# Patient Record
Sex: Male | Born: 1985 | Race: White | Hispanic: No | Marital: Single | Smoking: Current every day smoker
Health system: Southern US, Community
[De-identification: ages and names within clinical notes are randomized; demographics above are authoritative.]

## PROBLEM LIST (undated history)

## (undated) DIAGNOSIS — N200 Calculus of kidney: Secondary | ICD-10-CM

## (undated) DIAGNOSIS — J40 Bronchitis, not specified as acute or chronic: Secondary | ICD-10-CM

## (undated) DIAGNOSIS — F319 Bipolar disorder, unspecified: Secondary | ICD-10-CM

## (undated) DIAGNOSIS — K219 Gastro-esophageal reflux disease without esophagitis: Secondary | ICD-10-CM

---
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
## 2008-12-06 ENCOUNTER — Ambulatory Visit: Payer: Self-pay | Admitting: Family Medicine

## 2009-06-16 ENCOUNTER — Ambulatory Visit: Payer: Self-pay | Admitting: Family Medicine

## 2010-06-23 ENCOUNTER — Ambulatory Visit: Payer: Self-pay | Admitting: Family Medicine

## 2013-03-20 ENCOUNTER — Encounter (HOSPITAL_COMMUNITY): Payer: Self-pay | Admitting: Emergency Medicine

## 2013-03-20 ENCOUNTER — Emergency Department (HOSPITAL_COMMUNITY)
Admission: EM | Admit: 2013-03-20 | Discharge: 2013-03-20 | Disposition: A | Discharge status: Home / Self Care | Payer: Medicaid Other | Attending: Emergency Medicine | Admitting: Emergency Medicine

## 2013-03-20 DIAGNOSIS — K089 Disorder of teeth and supporting structures, unspecified: Secondary | ICD-10-CM | POA: Insufficient documentation

## 2013-03-20 DIAGNOSIS — Z8659 Personal history of other mental and behavioral disorders: Secondary | ICD-10-CM | POA: Insufficient documentation

## 2013-03-20 DIAGNOSIS — K0889 Other specified disorders of teeth and supporting structures: Secondary | ICD-10-CM

## 2013-03-20 DIAGNOSIS — Y939 Activity, unspecified: Secondary | ICD-10-CM | POA: Insufficient documentation

## 2013-03-20 DIAGNOSIS — F17200 Nicotine dependence, unspecified, uncomplicated: Secondary | ICD-10-CM | POA: Insufficient documentation

## 2013-03-20 DIAGNOSIS — R296 Repeated falls: Secondary | ICD-10-CM | POA: Insufficient documentation

## 2013-03-20 DIAGNOSIS — K029 Dental caries, unspecified: Secondary | ICD-10-CM | POA: Insufficient documentation

## 2013-03-20 DIAGNOSIS — Y929 Unspecified place or not applicable: Secondary | ICD-10-CM | POA: Insufficient documentation

## 2013-03-20 HISTORY — DX: Bipolar disorder, unspecified: F31.9

## 2013-03-20 MED ORDER — AMOXICILLIN 250 MG PO CAPS
500.0000 mg | ORAL_CAPSULE | Freq: Once | ORAL | Status: AC
  Administered 2013-03-20: 500 mg via ORAL
  Filled 2013-03-20: qty 2

## 2013-03-20 MED ORDER — IBUPROFEN 800 MG PO TABS: 800.0000 mg | ORAL_TABLET | Freq: Three times a day (TID) | ORAL | Status: DC

## 2013-03-20 MED ORDER — IBUPROFEN 800 MG PO TABS
800.0000 mg | ORAL_TABLET | Freq: Once | ORAL | Status: AC
  Administered 2013-03-20: 800 mg via ORAL
  Filled 2013-03-20: qty 1

## 2013-03-20 MED ORDER — AMOXICILLIN 500 MG PO CAPS: 500.0000 mg | ORAL_CAPSULE | Freq: Three times a day (TID) | ORAL | Status: DC

## 2013-03-20 NOTE — ED Notes (Signed)
Pt reports drinking heavily on Saturday, fell from knelled position with LOC. Reports feeling nauseous x 2days afterwards. C/o dental pain x 3 years. Scheduled for dentist appointment later this week.

## 2013-03-20 NOTE — ED Provider Notes (Signed)
CSN: [id number]     Arrival date & time 03/20/13  2212 History     First MD Initiated Contact with Patient 03/20/13 2246     Chief Complaint  Patient presents with  . Fall  . Dental Pain   (Consider location/radiation/quality/duration/timing/severity/associated sxs/prior Treatment) HPI Comments: Pt states that early morning of Aug 10 he was drinking heavily, fell from a kneeling position and hit his head. He states he later passed out, but thought it was from the alcohol. No problem walking or talking the next day. No n/v after the alcohol wore off.  No problems with performing ADL. No syncopal or near syncopal episodes since that time.  Patient is a 27 y.o. male presenting with fall and tooth pain. The history is provided by the patient.  Fall This is a chronic problem. The current episode started more than 1 year ago. The problem occurs intermittently. The problem has been gradually worsening. Pertinent negatives include no abdominal pain, arthralgias, chest pain, coughing, fever, nausea, neck pain or vomiting. Nothing aggravates the symptoms. He has tried NSAIDs for the symptoms.  Dental Pain Associated symptoms: no fever and no neck pain     Past Medical History  Diagnosis Date  . Bipolar 1 disorder    History reviewed. No pertinent past surgical history. History reviewed. No pertinent family history. History  Substance Use Topics  . Smoking status: Current Every Day Smoker  . Smokeless tobacco: Not on file  . Alcohol Use: Yes     Comment: occasional    Review of Systems  Constitutional: Negative for fever and activity change.       All ROS Neg except as noted in HPI  HENT: Positive for dental problem. Negative for nosebleeds and neck pain.   Eyes: Negative for photophobia and discharge.  Respiratory: Negative for cough, shortness of breath and wheezing.   Cardiovascular: Negative for chest pain and palpitations.  Gastrointestinal: Negative for nausea, vomiting,  abdominal pain and blood in stool.  Genitourinary: Negative for dysuria, frequency and hematuria.  Musculoskeletal: Negative for back pain and arthralgias.  Skin: Negative.   Neurological: Negative for dizziness, seizures and speech difficulty.  Psychiatric/Behavioral: Negative for hallucinations and confusion.    Allergies  Review of patient's allergies indicates no known allergies.  Home Medications   Current Outpatient Rx  Name  Route  Sig  Dispense  Refill  . ibuprofen (ADVIL,MOTRIN) 200 MG tablet   Oral   Take 200-800 mg by mouth every 6 (six) hours as needed for pain.          Marland Kitchen amoxicillin (AMOXIL) 500 MG capsule   Oral   Take 1 capsule (500 mg total) by mouth 3 (three) times daily.   21 capsule   0   . ibuprofen (ADVIL,MOTRIN) 800 MG tablet   Oral   Take 1 tablet (800 mg total) by mouth 3 (three) times daily.   21 tablet   0    BP 131/79  Pulse 88  Temp(Src) 98.2 F (36.8 C) (Oral)  Resp 18  SpO2 99% Physical Exam  Nursing note and vitals reviewed. Constitutional: He is oriented to person, place, and time. He appears well-developed and well-nourished.  Non-toxic appearance.  HENT:  Head: Normocephalic and atraumatic.  Right Ear: Tympanic membrane and external ear normal.  Left Ear: Tympanic membrane and external ear normal.  Multiple dental caries of the upper and lower gums. No visible abscess. No swelling under the tongue. Airway patent.  Eyes: EOM and lids  are normal. Pupils are equal, round, and reactive to light.  Neck: Normal range of motion. Neck supple. Carotid bruit is not present.  Cardiovascular: Normal rate, regular rhythm, normal heart sounds, intact distal pulses and normal pulses.   Pulmonary/Chest: Breath sounds normal. No respiratory distress.  Abdominal: Soft. Bowel sounds are normal. There is no tenderness. There is no guarding.  Musculoskeletal: Normal range of motion.  Lymphadenopathy:       Head (right side): No submandibular  adenopathy present.       Head (left side): No submandibular adenopathy present.    He has no cervical adenopathy.  Neurological: He is alert and oriented to person, place, and time. He has normal strength. No cranial nerve deficit or sensory deficit. He exhibits normal muscle tone. Coordination normal.  Gait steady. Speech clear and understandable.  Skin: Skin is warm and dry.  Psychiatric: He has a normal mood and affect. His speech is normal.    ED Course   Procedures (including critical care time)  Labs Reviewed - No data to display No results found. 1. Toothache     MDM  *I have reviewed nursing notes, vital signs, and all appropriate lab and imaging results for this patient.** Pt given dental resources guide. Rx for amoxil and ibuprofen 800mg  given. Pt has no gross neuro deficit at this time. No changes in his ADL since 8/10. No n/v. Family states he is eating and drinking as usual, and He denies any apparent changes or problem.  Feel that is is safe for patient to be discharged. Pt advised to return if he noted any changes or problem.  Kathie Dike, PA-C 03/20/13 2353

## 2013-03-21 NOTE — ED Provider Notes (Signed)
Medical screening examination/treatment/procedure(s) were performed by non-physician practitioner and as supervising physician I was immediately available for consultation/collaboration.  Warwick Nick, MD 03/21/13 0411 

## 2013-09-04 ENCOUNTER — Encounter (HOSPITAL_COMMUNITY): Payer: Self-pay | Admitting: Pharmacy Technician

## 2013-09-11 NOTE — Pre-Procedure Instructions (Signed)
Kurt Gillespie  09/11/2013   Your procedure is scheduled on:  Monday February 9 th at 1000 AM  Report to [facility] Short Stay Main Entrance "A" at 0800 AM.  Call this number if you have problems the morning of surgery: [phone]   Remember:   Do not eat food or drink liquids after midnight.   Take these medicines the morning of surgery with A SIP OF WATER: None   Do not wear jewelry.  Do not wear lotions,or powders.. You may wear deodorant.             Men may shave face and neck.  Do not bring valuables to the hospital.  [facility] is not responsible for any belongings or valuables.               Contacts, dentures or bridgework may not be worn into surgery.  Leave suitcase in the car. After surgery it may be brought to your room.  For patients admitted to the hospital, discharge time is determined by your treatment team.               Patients discharged the day of surgery will not be allowed to drive home.    Special Instructions:[place] - Preparing for Surgery  Before surgery, you can play an important role.  Because skin is not sterile, your skin needs to be as free of germs as possible.  You can reduce the number of germs on you skin by washing with CHG (chlorahexidine gluconate) soap before surgery.  CHG is an antiseptic cleaner which kills germs and bonds with the skin to continue killing germs even after washing.  Please DO NOT use if you have an allergy to CHG or antibacterial soaps.  If your skin becomes reddened/irritated stop using the CHG and inform your nurse when you arrive at Short Stay.  Do not shave (including legs and underarms) for at least 48 hours prior to the first CHG shower.  You may shave your face.  Please follow these instructions carefully:   1.  Shower with CHG Soap the night before surgery and the morning of Surgery.  2.  If you choose to wash your hair, wash your hair first as usual with your normal shampoo.  3.  After you shampoo, rinse your  hair and body thoroughly to remove the  Shampoo.  4.  Use CHG as you would any other liquid soap.  You can apply chg directly  to the skin and wash gently with scrungie or a clean washcloth.  5.  Apply the CHG Soap to your body ONLY FROM THE NECK DOWN.  Do not use on open wounds or open sores.  Avoid contact with your eyes, ears, mouth and genitals (private parts).  Wash genitals (private parts) with your normal soap.  6.  Wash thoroughly, paying special attention to the area where your surgery  will be performed.  7.  Thoroughly rinse your body with warm water from the neck down.  8.  DO NOT shower/wash with your normal soap after using and rinsing off the CHG Soap.  9.  Pat yourself dry with a clean towel.            10.  Wear clean pajamas.            11.  Place clean sheets on your bed the night of your first shower and do not  sleep with pets.  Day of Surgery  Do not apply any  lotions/deoderants the morning of surgery.  Please wear clean clothes to the hospital/surgery center.       Please read over the following fact sheets that you were given: Surgical Site Infection Prevention

## 2013-09-12 ENCOUNTER — Encounter (HOSPITAL_COMMUNITY): Payer: Self-pay

## 2013-09-12 ENCOUNTER — Encounter (HOSPITAL_COMMUNITY)
Admission: RE | Admit: 2013-09-12 | Discharge: 2013-09-12 | Disposition: A | Discharge status: Home / Self Care | Payer: Medicaid Other | Source: Ambulatory Visit | Attending: Oral Surgery | Admitting: Oral Surgery

## 2013-09-12 DIAGNOSIS — Z01812 Encounter for preprocedural laboratory examination: Secondary | ICD-10-CM | POA: Insufficient documentation

## 2013-09-12 HISTORY — DX: Bronchitis, not specified as acute or chronic: J40

## 2013-09-12 HISTORY — DX: Gastro-esophageal reflux disease without esophagitis: K21.9

## 2013-09-12 LAB — CBC
HEMATOCRIT: 40.9 % (ref 39.0–52.0)
HEMOGLOBIN: 14.3 g/dL (ref 13.0–17.0)
MCH: 29.5 pg (ref 26.0–34.0)
MCHC: 35 g/dL (ref 30.0–36.0)
MCV: 84.3 fL (ref 78.0–100.0)
Platelets: 240 10*3/uL (ref 150–400)
RBC: 4.85 MIL/uL (ref 4.22–5.81)
RDW: 13.1 % (ref 11.5–15.5)
WBC: 9.9 10*3/uL (ref 4.0–10.5)

## 2013-09-12 NOTE — H&P (Signed)
HISTORY AND PHYSICAL  Kurt LeydenKenneth Gillespie is a 28 y.o. male patient with CC: Dental pain  No diagnosis found.  Past Medical History  Diagnosis Date  . Bipolar 1 disorder   . Bronchitis     as a child  . GERD (gastroesophageal reflux disease)     No current facility-administered medications for this encounter.   Current Outpatient Prescriptions  Medication Sig Dispense Refill  . ibuprofen (ADVIL,MOTRIN) 200 MG tablet Take 200-800 mg by mouth every 6 (six) hours as needed for pain.        No Known Allergies Active Problems:   * No active hospital problems. *  Vitals: There were no vitals taken for this visit. Lab results: Results for orders placed during the hospital encounter of 09/12/13 (from the past 24 hour(s))  CBC     Status: None   Collection Time    09/12/13  9:25 AM      Result Value Range   WBC 9.9  4.0 - 10.5 K/uL   RBC 4.85  4.22 - 5.81 MIL/uL   Hemoglobin 14.3  13.0 - 17.0 g/dL   HCT 16.140.9  09.639.0 - 04.552.0 %   MCV 84.3  78.0 - 100.0 fL   MCH 29.5  26.0 - 34.0 pg   MCHC 35.0  30.0 - 36.0 g/dL   RDW 40.913.1  81.111.5 - 91.415.5 %   Platelets 240  150 - 400 K/uL   Radiology Results: No results found. General appearance: alert, cooperative, appears stated age and no distress Head: Normocephalic, without obvious abnormality, atraumatic Eyes: negative Nose: Nares normal. Septum midline. Mucosa normal. No drainage or sinus tenderness. Throat: multiple dental caries teeth #s 2-15, 17, 18, 20-32, generalized chronic periodontal disease, impacted tooth #32 Neck: no adenopathy, supple, symmetrical, trachea midline and thyroid not enlarged, symmetric, no tenderness/mass/nodules Resp: clear to auscultation bilaterally Cardio: regular rate and rhythm, S1, S2 normal, no murmur, click, rub or gallop  Assessment: Non-restorable teeth #'s 2, 3, 4, 5, 6, 7, 8, 9, 10, 11, 12, 13, 14, 15, 17, 18, 20, 21, 22, 23, 24, 25, 26, 27, 28, 29, 30, 31, impacted tooth #32.  Plan: Full mouth extractions,  alveoloplasty. General anesthesia. Day surgery.   Kurt Gillespie 09/12/2013

## 2013-09-14 NOTE — Progress Notes (Signed)
Left message for patient to arrive at 05:30 per Dr. Randa EvensJensen's protocol

## 2013-09-16 MED ORDER — CEFAZOLIN SODIUM-DEXTROSE 2-3 GM-% IV SOLR
2.0000 g | INTRAVENOUS | Status: AC
  Administered 2013-09-17: 2 g via INTRAVENOUS
  Filled 2013-09-16: qty 50

## 2013-09-17 ENCOUNTER — Ambulatory Visit (HOSPITAL_COMMUNITY): Payer: Medicaid Other | Admitting: Anesthesiology

## 2013-09-17 ENCOUNTER — Encounter (HOSPITAL_COMMUNITY): Payer: Medicaid Other | Admitting: Anesthesiology

## 2013-09-17 ENCOUNTER — Encounter (HOSPITAL_COMMUNITY)
Admission: RE | Disposition: A | Discharge status: Home / Self Care | Payer: Self-pay | Source: Ambulatory Visit | Attending: Oral Surgery

## 2013-09-17 ENCOUNTER — Encounter (HOSPITAL_COMMUNITY): Payer: Self-pay | Admitting: Anesthesiology

## 2013-09-17 ENCOUNTER — Ambulatory Visit (HOSPITAL_COMMUNITY)
Admission: RE | Admit: 2013-09-17 | Discharge: 2013-09-17 | Disposition: A | Discharge status: Home / Self Care | Payer: Medicaid Other | Source: Ambulatory Visit | Attending: Oral Surgery | Admitting: Oral Surgery

## 2013-09-17 DIAGNOSIS — K219 Gastro-esophageal reflux disease without esophagitis: Secondary | ICD-10-CM | POA: Insufficient documentation

## 2013-09-17 DIAGNOSIS — K029 Dental caries, unspecified: Secondary | ICD-10-CM | POA: Insufficient documentation

## 2013-09-17 DIAGNOSIS — F17200 Nicotine dependence, unspecified, uncomplicated: Secondary | ICD-10-CM | POA: Insufficient documentation

## 2013-09-17 DIAGNOSIS — K006 Disturbances in tooth eruption: Secondary | ICD-10-CM | POA: Insufficient documentation

## 2013-09-17 DIAGNOSIS — F319 Bipolar disorder, unspecified: Secondary | ICD-10-CM | POA: Insufficient documentation

## 2013-09-17 HISTORY — PX: MULTIPLE EXTRACTIONS WITH ALVEOLOPLASTY: SHX5342

## 2013-09-17 SURGERY — MULTIPLE EXTRACTION WITH ALVEOLOPLASTY
Anesthesia: General | Site: Mouth

## 2013-09-17 MED ORDER — MIDAZOLAM HCL 2 MG/2ML IJ SOLN: 0.5000 mg | Freq: Once | INTRAMUSCULAR | Status: DC | PRN

## 2013-09-17 MED ORDER — PROMETHAZINE HCL 25 MG/ML IJ SOLN
6.2500 mg | INTRAMUSCULAR | Status: DC | PRN
  Administered 2013-09-17: 12.5 mg via INTRAVENOUS

## 2013-09-17 MED ORDER — SODIUM CHLORIDE 0.9 % IR SOLN
Status: DC | PRN
  Administered 2013-09-17: 1000 mL

## 2013-09-17 MED ORDER — HYDROMORPHONE HCL PF 1 MG/ML IJ SOLN
INTRAMUSCULAR | Status: AC
  Administered 2013-09-17: 0.5 mg via INTRAVENOUS
  Filled 2013-09-17: qty 1

## 2013-09-17 MED ORDER — PROMETHAZINE HCL 25 MG/ML IJ SOLN
INTRAMUSCULAR | Status: AC
  Filled 2013-09-17: qty 1

## 2013-09-17 MED ORDER — FENTANYL CITRATE 0.05 MG/ML IJ SOLN
INTRAMUSCULAR | Status: AC
  Filled 2013-09-17: qty 5

## 2013-09-17 MED ORDER — MIDAZOLAM HCL 5 MG/5ML IJ SOLN
INTRAMUSCULAR | Status: DC | PRN
  Administered 2013-09-17: 2 mg via INTRAVENOUS

## 2013-09-17 MED ORDER — OXYMETAZOLINE HCL 0.05 % NA SOLN
NASAL | Status: DC | PRN
  Administered 2013-09-17: 1 via NASAL

## 2013-09-17 MED ORDER — LACTATED RINGERS IV SOLN
INTRAVENOUS | Status: DC | PRN
  Administered 2013-09-17 (×2): via INTRAVENOUS

## 2013-09-17 MED ORDER — LIDOCAINE HCL (CARDIAC) 20 MG/ML IV SOLN
INTRAVENOUS | Status: AC
  Filled 2013-09-17: qty 5

## 2013-09-17 MED ORDER — LIDOCAINE-EPINEPHRINE 2 %-1:100000 IJ SOLN
INTRAMUSCULAR | Status: DC | PRN
  Administered 2013-09-17: 20 mL via INTRADERMAL

## 2013-09-17 MED ORDER — OXYCODONE-ACETAMINOPHEN 5-325 MG PO TABS
1.0000 | ORAL_TABLET | ORAL | Status: DC | PRN
Start: 2013-09-17 — End: 2014-03-14

## 2013-09-17 MED ORDER — MIDAZOLAM HCL 2 MG/2ML IJ SOLN
INTRAMUSCULAR | Status: AC
  Filled 2013-09-17: qty 2

## 2013-09-17 MED ORDER — LIDOCAINE-EPINEPHRINE 2 %-1:100000 IJ SOLN
INTRAMUSCULAR | Status: AC
Start: 2013-09-17 — End: 2013-09-17
  Filled 2013-09-17: qty 1

## 2013-09-17 MED ORDER — LIDOCAINE HCL (CARDIAC) 20 MG/ML IV SOLN
INTRAVENOUS | Status: DC | PRN
  Administered 2013-09-17: 40 mg via INTRAVENOUS

## 2013-09-17 MED ORDER — OXYCODONE HCL 5 MG/5ML PO SOLN: 5.0000 mg | Freq: Once | ORAL | Status: AC | PRN

## 2013-09-17 MED ORDER — OXYCODONE HCL 5 MG PO TABS
ORAL_TABLET | ORAL | Status: AC
  Filled 2013-09-17: qty 1

## 2013-09-17 MED ORDER — PROPOFOL 10 MG/ML IV BOLUS
INTRAVENOUS | Status: AC
  Filled 2013-09-17: qty 20

## 2013-09-17 MED ORDER — HYDROMORPHONE HCL PF 1 MG/ML IJ SOLN
0.2500 mg | INTRAMUSCULAR | Status: DC | PRN
Start: 2013-09-17 — End: 2013-09-17
  Administered 2013-09-17: 0.25 mg via INTRAVENOUS
  Administered 2013-09-17 (×3): 0.5 mg via INTRAVENOUS

## 2013-09-17 MED ORDER — 0.9 % SODIUM CHLORIDE (POUR BTL) OPTIME
TOPICAL | Status: DC | PRN
  Administered 2013-09-17: 1000 mL

## 2013-09-17 MED ORDER — OXYCODONE HCL 5 MG PO TABS
5.0000 mg | ORAL_TABLET | Freq: Once | ORAL | Status: AC | PRN
Start: 2013-09-17 — End: 2013-09-17
  Administered 2013-09-17: 5 mg via ORAL

## 2013-09-17 MED ORDER — LACTATED RINGERS IV SOLN
INTRAVENOUS | Status: DC
  Administered 2013-09-17: 07:00:00 via INTRAVENOUS

## 2013-09-17 MED ORDER — FENTANYL CITRATE 0.05 MG/ML IJ SOLN
INTRAMUSCULAR | Status: DC | PRN
  Administered 2013-09-17: 100 ug via INTRAVENOUS
  Administered 2013-09-17: 150 ug via INTRAVENOUS

## 2013-09-17 MED ORDER — PROPOFOL 10 MG/ML IV BOLUS
INTRAVENOUS | Status: DC | PRN
  Administered 2013-09-17: 200 mg via INTRAVENOUS

## 2013-09-17 MED ORDER — MEPERIDINE HCL 25 MG/ML IJ SOLN: 6.2500 mg | INTRAMUSCULAR | Status: DC | PRN

## 2013-09-17 MED ORDER — ONDANSETRON HCL 4 MG/2ML IJ SOLN
INTRAMUSCULAR | Status: AC
  Filled 2013-09-17: qty 2

## 2013-09-17 MED ORDER — ONDANSETRON HCL 4 MG/2ML IJ SOLN
INTRAMUSCULAR | Status: DC | PRN
  Administered 2013-09-17: 4 mg via INTRAVENOUS

## 2013-09-17 MED ORDER — SUCCINYLCHOLINE CHLORIDE 20 MG/ML IJ SOLN
INTRAMUSCULAR | Status: DC | PRN
  Administered 2013-09-17: 100 mg via INTRAVENOUS

## 2013-09-17 SURGICAL SUPPLY — 28 items
BUR CROSS CUT FISSURE 1.6 (BURR) ×2 IMPLANT
BUR CROSS CUT FISSURE 1.6MM (BURR) ×1
BUR EGG ELITE 4.0 (BURR) ×1 IMPLANT
BUR EGG ELITE 4.0MM (BURR) ×1
CANISTER SUCTION 2500CC (MISCELLANEOUS) ×3 IMPLANT
COVER SURGICAL LIGHT HANDLE (MISCELLANEOUS) ×3 IMPLANT
CRADLE DONUT ADULT HEAD (MISCELLANEOUS) ×3 IMPLANT
DECANTER SPIKE VIAL GLASS SM (MISCELLANEOUS) ×3 IMPLANT
GAUZE PACKING FOLDED 2  STR (GAUZE/BANDAGES/DRESSINGS) ×2
GAUZE PACKING FOLDED 2 STR (GAUZE/BANDAGES/DRESSINGS) ×1 IMPLANT
GLOVE BIO SURGEON STRL SZ 6.5 (GLOVE) ×4 IMPLANT
GLOVE BIO SURGEON STRL SZ7.5 (GLOVE) ×3 IMPLANT
GLOVE BIO SURGEONS STRL SZ 6.5 (GLOVE) ×2
GLOVE BIOGEL PI IND STRL 7.0 (GLOVE) ×2 IMPLANT
GLOVE BIOGEL PI INDICATOR 7.0 (GLOVE) ×4
GOWN STRL NON-REIN LRG LVL3 (GOWN DISPOSABLE) ×6 IMPLANT
GOWN STRL REIN XL XLG (GOWN DISPOSABLE) ×3 IMPLANT
KIT BASIN OR (CUSTOM PROCEDURE TRAY) ×3 IMPLANT
KIT ROOM TURNOVER OR (KITS) ×3 IMPLANT
NEEDLE 22X1 1/2 (OR ONLY) (NEEDLE) ×3 IMPLANT
NS IRRIG 1000ML POUR BTL (IV SOLUTION) ×3 IMPLANT
PAD ARMBOARD 7.5X6 YLW CONV (MISCELLANEOUS) ×6 IMPLANT
SUT CHROMIC 3 0 PS 2 (SUTURE) ×3 IMPLANT
SYR CONTROL 10ML LL (SYRINGE) ×3 IMPLANT
TOWEL OR 17X26 10 PK STRL BLUE (TOWEL DISPOSABLE) ×3 IMPLANT
TRAY ENT MC OR (CUSTOM PROCEDURE TRAY) ×3 IMPLANT
TUBING IRRIGATION (MISCELLANEOUS) ×2 IMPLANT
YANKAUER SUCT BULB TIP NO VENT (SUCTIONS) ×3 IMPLANT

## 2013-09-17 NOTE — Discharge Instructions (Signed)
What to Eat after Tooth extraction:   ° ° °For your first meals, you should eat lightly; only small meals at first.   Avoid Sharp, Crunchy, and Hot foods.   If you do not have nausea, you may eat larger meals.  Avoid spicy, greasy and heavy food, as these may make you sick after the anesthesia.  ° ° °General Anesthesia, Adult, Care After  °Refer to this sheet in the next few weeks. These instructions provide you with information on caring for yourself after your procedure. Your health care provider may also give you more specific instructions. Your treatment has been planned according to current medical practices, but problems sometimes occur. Call your health care provider if you have any problems or questions after your procedure.  °WHAT TO EXPECT AFTER THE PROCEDURE  °After the procedure, it is typical to experience:  °Sleepiness.  °Nausea and vomiting. °HOME CARE INSTRUCTIONS  °For the first 24 hours after general anesthesia:  °Have a responsible person with you.  °Do not drive a car. If you are alone, do not take public transportation.  °Do not drink alcohol.  °Do not take medicine that has not been prescribed by your health care provider.  °Do not sign important papers or make important decisions.  °You may resume a normal diet and activities as directed by your health care provider.  °Change bandages (dressings) as directed.  °If you have questions or problems that seem related to general anesthesia, call the hospital and ask for the anesthetist or anesthesiologist on call. °SEEK MEDICAL CARE IF:  °You have nausea and vomiting that continue the day after anesthesia.  °You develop a rash. °SEEK IMMEDIATE MEDICAL CARE IF:  °You have difficulty breathing.  °You have chest pain.  °You have any allergic problems. °Document Released: 11/01/2000 Document Revised: 03/28/2013 Document Reviewed: 02/08/2013  °Exit[employer]® Patient Information ©2014 [employer].  ° ° °

## 2013-09-17 NOTE — H&P (Signed)
H&P documentation  -History and Physical Reviewed  -Patient has been re-examined  -No change in the plan of care  Kurt Gillespie M  

## 2013-09-17 NOTE — Anesthesia Postprocedure Evaluation (Signed)
  Anesthesia Post-op Note  Patient: Kurt Gillespie  Procedure(s) Performed: Procedure(s): MULTIPLE EXTRACION WITH ALVEOLOPLASTY (N/A)  Patient Location: PACU  Anesthesia Type:General  Level of Consciousness: awake, alert , oriented and patient cooperative  Airway and Oxygen Therapy: Patient Spontanous Breathing  Post-op Pain: none  Post-op Assessment: Post-op Vital signs reviewed, Patient's Cardiovascular Status Stable, Respiratory Function Stable, Patent Airway, No signs of Nausea or vomiting and Pain level controlled  Post-op Vital Signs: Reviewed and stable  Complications: No apparent anesthesia complications

## 2013-09-17 NOTE — Progress Notes (Signed)
Patient states he is having nausea, phenergan given per MAR,  IV continued, LR for hydration.  Pt had small amount of vomitus.

## 2013-09-17 NOTE — Transfer of Care (Signed)
Immediate Anesthesia Transfer of Care Note  Patient: Kurt LeydenKenneth Gillespie  Procedure(s) Performed: Procedure(s): MULTIPLE EXTRACION WITH ALVEOLOPLASTY (N/A)  Patient Location: PACU  Anesthesia Type:General  Level of Consciousness: awake, alert , oriented and patient cooperative  Airway & Oxygen Therapy: Patient Spontanous Breathing and Patient connected to nasal cannula oxygen  Post-op Assessment: Report given to PACU RN, Post -op Vital signs reviewed and stable and Patient moving all extremities  Post vital signs: Reviewed and stable  Complications: No apparent anesthesia complications

## 2013-09-17 NOTE — Op Note (Signed)
09/17/2013  9:31 AM  PATIENT:  Kurt Gillespie  28 y.o. male  PRE-OPERATIVE DIAGNOSIS:  NON-RESTRABLE TEETH # 2, 3, 4, 5, 6, 7, 8, 9, 10, 11, 12, 13, 14, 15, 17, 18, 20, 21, 22, 23, 24, 25, 26, 27, 28, 29, 30, 31, Impacted tooth # 32  POST-OPERATIVE DIAGNOSIS:  SAME  PROCEDURE:  Procedure(s): MULTIPLE EXTRACITON TEETH # 2, 3, 4, 5, 6, 7, 8, 9, 10, 11, 12, 13, 14, 15, 17, 18, 20, 21, 22, 23, 24, 25, 26, 27, 28, 29, 30, 31,  32  WITH ALVEOLOPLASTY  SURGEON:  Surgeon(s): Kurt Gillespie, DDS  ANESTHESIA:   local and general  EBL:  minimal  DRAINS: none   SPECIMEN:  No Specimen  COUNTS:  YES  PLAN OF CARE: Discharge to home after PACU  PATIENT DISPOSITION:  PACU - hemodynamically stable.   PROCEDURE DETAILS: Dictation #[id number]  Kurt Gillespie, DMD 09/17/2013 9:31 AM

## 2013-09-17 NOTE — Anesthesia Preprocedure Evaluation (Addendum)
Anesthesia Evaluation  Patient identified by MRN, date of birth, ID band Patient awake    Reviewed: Allergy & Precautions, H&P , NPO status , Patient's Chart, lab work & pertinent test results  History of Anesthesia Complications Negative for: history of anesthetic complications  Airway Mallampati: II TM Distance: >3 FB Neck ROM: Full    Dental  (+) Poor Dentition, Loose, Missing and Dental Advisory Given   Pulmonary Current Smoker,  breath sounds clear to auscultation        Cardiovascular negative cardio ROS  Rhythm:Regular Rate:Normal     Neuro/Psych Bipolar Disorder    GI/Hepatic Neg liver ROS, GERD-  Poorly Controlled,  Endo/Other  Morbid obesity  Renal/GU negative Renal ROS     Musculoskeletal   Abdominal (+) + obese,   Peds  Hematology negative hematology ROS (+)   Anesthesia Other Findings   Reproductive/Obstetrics                          Anesthesia Physical Anesthesia Plan  ASA: II  Anesthesia Plan: General   Post-op Pain Management:    Induction: Intravenous  Airway Management Planned: Nasal ETT  Additional Equipment:   Intra-op Plan:   Post-operative Plan: Extubation in OR  Informed Consent: I have reviewed the patients History and Physical, chart, labs and discussed the procedure including the risks, benefits and alternatives for the proposed anesthesia with the patient or authorized representative who has indicated his/her understanding and acceptance.   Dental advisory given  Plan Discussed with: CRNA and Surgeon  Anesthesia Plan Comments: (Plan routine monitors, GETA )        Anesthesia Quick Evaluation

## 2013-09-18 NOTE — Op Note (Signed)
NAME:  Kurt Gillespie, Kurt Gillespie NO.:  [id number]  MEDICAL RECORD NO.:  [id number]  LOCATION:  [facility]                         FACILITY:  [facility]  PHYSICIAN:  Georgia Lopes, M.D.  DATE OF BIRTH:  03-Sep-1985  DATE OF PROCEDURE:  09/17/2013 DATE OF DISCHARGE:  09/17/2013                              OPERATIVE REPORT   PREOPERATIVE DIAGNOSIS:  Nonrestorable teeth #2, #3, #4, #5, #6, #7, #8, #9, #10, #11, #12, #13, #14, #15, #17, #18, #20, #21, #22, #23, #24, #25, #26, #27, #28, #29, #30, #31, and impacted tooth #32.  POSTOPERATIVE DIAGNOSIS:  Nonrestorable teeth #2, #3, #4, #5, #6, #7, #8, #9, #10, #11, #12, #13, #14, #15, #17, #18, #20, #21, #22, #23, #24, #25, #26, #27, #28, #29, #30, #31, and impacted tooth #32.  PROCEDURES:  Extraction of teeth #2, #3, #4, #5, #6, #7, #8, #9, #10, #11, #12, #13, #14, #15, #17, #18, #20, #21, #22, #23, #24, #25, #26, #27, #28, #29, #30, #31, and #32.  Alveoplasty right and left maxilla and mandible.  SURGEON:  Georgia Lopes, M.D.  ANESTHESIA:  General, Dr. Jean Rosenthal attending, nasal intubation.  PROCEDURE IN DETAIL:  The patient was taken to the operating room, placed on the table in supine position.  General anesthesia was administered intravenously and a nasal endotracheal tube was placed and secured.  The eyes were protected.  The patient was draped for the procedure.  Time-out was performed.  The posterior pharynx was suctioned.  A throat pack was placed.  A 2% lidocaine 1:100,000 epinephrine was infiltrated in an inferior alveolar block on the right and left side and in buccal and palatal infiltration in the maxilla. Total of 18 mL was utilized.  A bite block was placed in the right side of the mouth and a sweetheart retractor was used to retract the tongue. A #15 blade was used to make an incision beginning at tooth #17 and carrying forward and anteriorly to tooth #26 on the buccal and lingual aspects at the mucogingival junction.   In the maxilla, a #15 blade was used to make an incision beginning at tooth #15 and carrying forward to tooth #7 on the buccal and palatal surfaces at the mucogingival junction.  Then, the periosteum was reflected.  The 301 elevator was used to elevate the teeth in the mandible teeth #17, #18, #19, #20, #21; all required bone removal using Stryker handpiece, and then the teeth were removed with the lower forceps.  In the maxilla, the teeth were elevated and removed with the universal forceps.  Tooth #12 fractured during removal and additional bone was removed using Stryker handpiece, and additional bone was removed around tooth #10 and #13.  Tooth #14 required sectioning prior to removal with a rongeurs.  Then, the sockets were curetted.  The periosteum was further reflected in the maxilla and mandible on the left side to expose the alveolar crest and then the egg- shaped bur was used to perform alveoplasty and then the area was further smoothed using the bone file.  The area was irrigated and closed with 3- 0 chromic.  A larger bite block and sweetheart retractor were placed in the left  side of the mouth, and attention was turned to the right side. A #15 blade was used to make an incision in the mandible beginning overlying tooth #32, and carried forward to tooth #27 on the buccal and lingual surfaces.  Then, an incision was made in the maxilla beginning at tooth #2 and carrying for tooth #6 on the buccal and palatal surfaces.  The periosteum was reflected.  The fissure bur was used and a Stryker handpiece to remove interproximal bone from around these teeth and the teeth were elevated and removed with the upper and lower universal forceps.  Tooth #32 required additional bone removal and sectioning prior to removal as it was impacted.  The periosteum was then further reflected in the maxilla and mandible to expose the alveolar crest and then the egg-shaped bur and bone file used to  perform the alveoplasty.  Then, the areas were irrigated and closed with 3-0 chromic.  The oral cavity was inspected and found to have good contour hemostasis and closure.  Then, the oral cavity was irrigated and suctioned.  Throat pack was removed.  The patient was awakened and taken to the recovery room, breathing spontaneously in good condition.  ESTIMATED BLOOD LOSS:  Minimal.  COMPLICATIONS:  None.  SPECIMENS:  None.     Georgia LopesScott M. Jaline Pincock, M.D.     SMJ/MEDQ  D:  09/17/2013  T:  09/18/2013  Job:  [id number]

## 2013-09-20 ENCOUNTER — Encounter (HOSPITAL_COMMUNITY): Payer: Self-pay | Admitting: Oral Surgery

## 2014-03-14 ENCOUNTER — Emergency Department (HOSPITAL_COMMUNITY): Payer: Medicaid Other

## 2014-03-14 ENCOUNTER — Emergency Department (HOSPITAL_COMMUNITY)
Admission: EM | Admit: 2014-03-14 | Discharge: 2014-03-14 | Disposition: A | Discharge status: Home / Self Care | Payer: Medicaid Other | Attending: Emergency Medicine | Admitting: Emergency Medicine

## 2014-03-14 ENCOUNTER — Encounter (HOSPITAL_COMMUNITY): Payer: Self-pay | Admitting: Emergency Medicine

## 2014-03-14 DIAGNOSIS — Z87891 Personal history of nicotine dependence: Secondary | ICD-10-CM | POA: Insufficient documentation

## 2014-03-14 DIAGNOSIS — K7689 Other specified diseases of liver: Secondary | ICD-10-CM | POA: Insufficient documentation

## 2014-03-14 DIAGNOSIS — R112 Nausea with vomiting, unspecified: Secondary | ICD-10-CM | POA: Diagnosis not present

## 2014-03-14 DIAGNOSIS — Z8709 Personal history of other diseases of the respiratory system: Secondary | ICD-10-CM | POA: Insufficient documentation

## 2014-03-14 DIAGNOSIS — R197 Diarrhea, unspecified: Secondary | ICD-10-CM | POA: Diagnosis not present

## 2014-03-14 DIAGNOSIS — R509 Fever, unspecified: Secondary | ICD-10-CM | POA: Insufficient documentation

## 2014-03-14 DIAGNOSIS — Z8659 Personal history of other mental and behavioral disorders: Secondary | ICD-10-CM | POA: Diagnosis not present

## 2014-03-14 DIAGNOSIS — R109 Unspecified abdominal pain: Secondary | ICD-10-CM | POA: Insufficient documentation

## 2014-03-14 DIAGNOSIS — R638 Other symptoms and signs concerning food and fluid intake: Secondary | ICD-10-CM | POA: Insufficient documentation

## 2014-03-14 DIAGNOSIS — K769 Liver disease, unspecified: Secondary | ICD-10-CM

## 2014-03-14 DIAGNOSIS — Z791 Long term (current) use of non-steroidal anti-inflammatories (NSAID): Secondary | ICD-10-CM | POA: Diagnosis not present

## 2014-03-14 LAB — LIPASE, BLOOD: Lipase: 18 U/L (ref 11–59)

## 2014-03-14 LAB — CBC WITH DIFFERENTIAL/PLATELET
Basophils Absolute: 0 10*3/uL (ref 0.0–0.1)
Basophils Relative: 0 % (ref 0–1)
Eosinophils Absolute: 0.2 10*3/uL (ref 0.0–0.7)
Eosinophils Relative: 3 % (ref 0–5)
HCT: 42.3 % (ref 39.0–52.0)
Hemoglobin: 14.6 g/dL (ref 13.0–17.0)
LYMPHS ABS: 2.7 10*3/uL (ref 0.7–4.0)
LYMPHS PCT: 39 % (ref 12–46)
MCH: 28.9 pg (ref 26.0–34.0)
MCHC: 34.5 g/dL (ref 30.0–36.0)
MCV: 83.8 fL (ref 78.0–100.0)
MONO ABS: 0.6 10*3/uL (ref 0.1–1.0)
Monocytes Relative: 9 % (ref 3–12)
Neutro Abs: 3.3 10*3/uL (ref 1.7–7.7)
Neutrophils Relative %: 49 % (ref 43–77)
Platelets: 230 10*3/uL (ref 150–400)
RBC: 5.05 MIL/uL (ref 4.22–5.81)
RDW: 13.1 % (ref 11.5–15.5)
WBC: 6.7 10*3/uL (ref 4.0–10.5)

## 2014-03-14 LAB — URINALYSIS, ROUTINE W REFLEX MICROSCOPIC
Bilirubin Urine: NEGATIVE
GLUCOSE, UA: NEGATIVE mg/dL
Hgb urine dipstick: NEGATIVE
Ketones, ur: NEGATIVE mg/dL
LEUKOCYTES UA: NEGATIVE
Nitrite: NEGATIVE
PH: 6 (ref 5.0–8.0)
Protein, ur: NEGATIVE mg/dL
Specific Gravity, Urine: 1.025 (ref 1.005–1.030)
Urobilinogen, UA: 0.2 mg/dL (ref 0.0–1.0)

## 2014-03-14 LAB — COMPREHENSIVE METABOLIC PANEL
ALT: 39 U/L (ref 0–53)
AST: 22 U/L (ref 0–37)
Albumin: 4 g/dL (ref 3.5–5.2)
Alkaline Phosphatase: 84 U/L (ref 39–117)
Anion gap: 10 (ref 5–15)
BILIRUBIN TOTAL: 0.6 mg/dL (ref 0.3–1.2)
BUN: 15 mg/dL (ref 6–23)
CALCIUM: 9.1 mg/dL (ref 8.4–10.5)
CHLORIDE: 104 meq/L (ref 96–112)
CO2: 26 meq/L (ref 19–32)
CREATININE: 0.92 mg/dL (ref 0.50–1.35)
GLUCOSE: 112 mg/dL — AB (ref 70–99)
Potassium: 4.1 mEq/L (ref 3.7–5.3)
Sodium: 140 mEq/L (ref 137–147)
Total Protein: 6.9 g/dL (ref 6.0–8.3)

## 2014-03-14 MED ORDER — ONDANSETRON HCL 4 MG PO TABS: 4.0000 mg | ORAL_TABLET | Freq: Four times a day (QID) | ORAL | Status: DC

## 2014-03-14 MED ORDER — ONDANSETRON HCL 4 MG/2ML IJ SOLN
4.0000 mg | Freq: Once | INTRAMUSCULAR | Status: AC
  Administered 2014-03-14: 4 mg via INTRAVENOUS
  Filled 2014-03-14: qty 2

## 2014-03-14 MED ORDER — IOHEXOL 300 MG/ML  SOLN
100.0000 mL | Freq: Once | INTRAMUSCULAR | Status: AC | PRN
  Administered 2014-03-14: 100 mL via INTRAVENOUS

## 2014-03-14 MED ORDER — SODIUM CHLORIDE 0.9 % IJ SOLN
INTRAMUSCULAR | Status: AC
  Filled 2014-03-14: qty 24

## 2014-03-14 MED ORDER — IOHEXOL 300 MG/ML  SOLN
50.0000 mL | Freq: Once | INTRAMUSCULAR | Status: AC | PRN
  Administered 2014-03-14: 50 mL via ORAL

## 2014-03-14 MED ORDER — SODIUM CHLORIDE 0.9 % IV BOLUS (SEPSIS)
1000.0000 mL | Freq: Once | INTRAVENOUS | Status: AC
  Administered 2014-03-14: 1000 mL via INTRAVENOUS

## 2014-03-14 NOTE — Discharge Instructions (Signed)
Viral Gastroenteritis Follow up with your doctor for an MRI of your liver. Return to the ED if you develop new or worsening symptoms. Viral gastroenteritis is also known as stomach flu. This condition affects the stomach and intestinal tract. It can cause sudden diarrhea and vomiting. The illness typically lasts 3 to 8 days. Most people develop an immune response that eventually gets rid of the virus. While this natural response develops, the virus can make you quite ill. CAUSES  Many different viruses can cause gastroenteritis, such as rotavirus or noroviruses. You can catch one of these viruses by consuming contaminated food or water. You may also catch a virus by sharing utensils or other personal items with an infected person or by touching a contaminated surface. SYMPTOMS  The most common symptoms are diarrhea and vomiting. These problems can cause a severe loss of body fluids (dehydration) and a body salt (electrolyte) imbalance. Other symptoms may include:  Fever.  Headache.  Fatigue.  Abdominal pain. DIAGNOSIS  Your caregiver can usually diagnose viral gastroenteritis based on your symptoms and a physical exam. A stool sample may also be taken to test for the presence of viruses or other infections. TREATMENT  This illness typically goes away on its own. Treatments are aimed at rehydration. The most serious cases of viral gastroenteritis involve vomiting so severely that you are not able to keep fluids down. In these cases, fluids must be given through an intravenous line (IV). HOME CARE INSTRUCTIONS   Drink enough fluids to keep your urine clear or pale yellow. Drink small amounts of fluids frequently and increase the amounts as tolerated.  Ask your caregiver for specific rehydration instructions.  Avoid:  Foods high in sugar.  Alcohol.  Carbonated drinks.  Tobacco.  Juice.  Caffeine drinks.  Extremely hot or cold fluids.  Fatty, greasy foods.  Too much intake of  anything at one time.  Dairy products until 24 to 48 hours after diarrhea stops.  You may consume probiotics. Probiotics are active cultures of beneficial bacteria. They may lessen the amount and number of diarrheal stools in adults. Probiotics can be found in yogurt with active cultures and in supplements.  Wash your hands well to avoid spreading the virus.  Only take over-the-counter or prescription medicines for pain, discomfort, or fever as directed by your caregiver. Do not give aspirin to children. Antidiarrheal medicines are not recommended.  Ask your caregiver if you should continue to take your regular prescribed and over-the-counter medicines.  Keep all follow-up appointments as directed by your caregiver. SEEK IMMEDIATE MEDICAL CARE IF:   You are unable to keep fluids down.  You do not urinate at least once every 6 to 8 hours.  You develop shortness of breath.  You notice blood in your stool or vomit. This may look like coffee grounds.  You have abdominal pain that increases or is concentrated in one small area (localized).  You have persistent vomiting or diarrhea.  You have a fever.  The patient is a child younger than 3 months, and he or she has a fever.  The patient is a child older than 3 months, and he or she has a fever and persistent symptoms.  The patient is a child older than 3 months, and he or she has a fever and symptoms suddenly get worse.  The patient is a baby, and he or she has no tears when crying. MAKE SURE YOU:   Understand these instructions.  Will watch your condition.  Will  get help right away if you are not doing well or get worse. Document Released: 07/26/2005 Document Revised: 10/18/2011 Document Reviewed: 05/12/2011 [facility] Patient Information 2015 [place], [place]. This information is not intended to replace advice given to you by your health care provider. Make sure you discuss any questions you have with your health care  provider.

## 2014-03-14 NOTE — ED Provider Notes (Signed)
CSN: [id number]     Arrival date & time 03/14/14  1446 History   First MD Initiated Contact with Patient 03/14/14 1458     Chief Complaint  Patient presents with  . Abdominal Pain     (Consider location/radiation/quality/duration/timing/severity/associated sxs/prior Treatment) HPI Comments: Patient presents with 2 day history of diarrhea, vomiting, lower abdominal pain.  States fever to 101 yesterday.  Denies urinary symptoms or testicular pain. No sick contacts, recent travel or antibiotic use.  States vomiting x2, last time yesterday.  5 episodes of loose bloody stools.  TTP R lower abdomen.  Pain does not radiate, nothing makes it better or worse.  No CP or SOB.  The history is provided by the patient.    Past Medical History  Diagnosis Date  . Bipolar 1 disorder   . Bronchitis     as a child  . GERD (gastroesophageal reflux disease)    Past Surgical History  Procedure Laterality Date  . Wisdom tooth extraction  1999    age 56 years  . Multiple extractions with alveoloplasty N/A 09/17/2013    Procedure: MULTIPLE EXTRACION WITH ALVEOLOPLASTY;  Surgeon: Georgia Lopes, DDS;  Location: [facility] OR;  Service: Oral Surgery;  Laterality: N/A;   History reviewed. No pertinent family history. History  Substance Use Topics  . Smoking status: Former Smoker -- 0.50 packs/day for 10 years    Types: Cigarettes    Quit date: 01/12/2014  . Smokeless tobacco: Never Used  . Alcohol Use: Yes     Comment: occasional    Review of Systems  Constitutional: Positive for fever, activity change and appetite change.  Respiratory: Negative for cough, chest tightness and shortness of breath.   Cardiovascular: Negative for chest pain.  Gastrointestinal: Positive for nausea, vomiting and abdominal pain. Negative for blood in stool.  Genitourinary: Negative for dysuria and hematuria.  Musculoskeletal: Negative for arthralgias, back pain and myalgias.  Skin: Negative for rash.  Neurological: Negative for  dizziness, weakness and headaches.  A complete 10 system review of systems was obtained and all systems are negative except as noted in the HPI and PMH.      Allergies  Review of patient's allergies indicates no known allergies.  Home Medications   Prior to Admission medications   Medication Sig Start Date End Date Taking? Authorizing Provider  acetaminophen (TYLENOL) 500 MG tablet Take 1,000 mg by mouth every 8 (eight) hours as needed. pain   Yes Historical Provider, MD  ibuprofen (ADVIL,MOTRIN) 200 MG tablet Take 200-800 mg by mouth every 6 (six) hours as needed for pain.     Historical Provider, MD  ondansetron (ZOFRAN) 4 MG tablet Take 1 tablet (4 mg total) by mouth every 6 (six) hours. 03/14/14   Glynn Octave, MD   BP 114/65  Pulse 62  Temp(Src) 98.4 F (36.9 C) (Oral)  Resp 20  Ht 5\' 10"  (1.778 m)  Wt 210 lb (95.255 kg)  BMI 30.13 kg/m2  SpO2 100% Physical Exam  Nursing note and vitals reviewed. Constitutional: He is oriented to person, place, and time. He appears well-developed and well-nourished. No distress.  HENT:  Head: Normocephalic and atraumatic.  Mouth/Throat: Oropharynx is clear and moist. No oropharyngeal exudate.  Eyes: Conjunctivae and EOM are normal. Pupils are equal, round, and reactive to light.  Neck: Normal range of motion. Neck supple.  No meningismus.  Cardiovascular: Normal rate, regular rhythm, normal heart sounds and intact distal pulses.   No murmur heard. Pulmonary/Chest: Effort normal and breath  sounds normal. No respiratory distress.  Abdominal: Soft. There is tenderness. There is guarding. There is no rebound.  TTP RLQ with guarding  Genitourinary:  No testicular pain  Musculoskeletal: Normal range of motion. He exhibits no edema and no tenderness.  No CVAT  Neurological: He is alert and oriented to person, place, and time. No cranial nerve deficit. He exhibits normal muscle tone. Coordination normal.  No ataxia on finger to nose  bilaterally. No pronator drift. 5/5 strength throughout. CN 2-12 intact. Negative Romberg. Equal grip strength. Sensation intact. Gait is normal.   Skin: Skin is warm.  Psychiatric: He has a normal mood and affect. His behavior is normal.    ED Course  Procedures (including critical care time) Labs Review Labs Reviewed  COMPREHENSIVE METABOLIC PANEL - Abnormal; Notable for the following:    Glucose, Bld 112 (*)    All other components within normal limits  CBC WITH DIFFERENTIAL  LIPASE, BLOOD  URINALYSIS, ROUTINE W REFLEX MICROSCOPIC    Imaging Review Ct Abdomen Pelvis W Contrast  03/14/2014   CLINICAL DATA:  Right lower quadrant pain.  nausea and vomiting.  EXAM: CT ABDOMEN AND PELVIS WITH CONTRAST  TECHNIQUE: Multidetector CT imaging of the abdomen and pelvis was performed using the standard protocol following bolus administration of intravenous contrast.  CONTRAST:  100mL OMNIPAQUE IOHEXOL 300 MG/ML  SOLN  COMPARISON:  None.  FINDINGS: Liver: Hepatic steatosis demonstrated. A 1.3 cm flash filling hypervascular lesion is seen in the superior left hepatic lobe on image 12 of series 2. This most likely represents a benign hemangioma or focal nodular hyperplasia. No other liver lesions are identified.  Gallbladder:  Unremarkable.  Pancreas: No mass, inflammatory changes, or other parenchymal abnormality identified.  Spleen:  Within normal limits in size and appearance.  Adrenal Glands:  No mass identified.  Kidneys/Urinary Tract: No masses identified. No evidence of hydronephrosis.  Bowel: No evidence of wall thickening, mass, or obstruction. Appendix not well visualized, however there is no evidence of inflammatory process in the area the cecum or elsewhere within the abdomen or pelvis.  Lymph Nodes:  No pathologically enlarged lymph nodes identified.  Pelvic/Reproductive Organs: No mass or other abnormality identified.  Vascular:  No evidence of abdominal aortic aneurysm.  Musculoskeletal:  No  suspicious bone lesions identified.  Other:  None.  IMPRESSION: No acute findings.  Hepatic steatosis.  Small flash filling hypervascular lesion in the left hepatic lobe. In the absence of risk factors for hepatic malignancy, this most likely represents a benign hemangioma or focal nodular hyperplasia. If patient does have risk factors for hepatic malignancy, further evaluation with abdomen MRI or follow-up CT is recommended. This recommendation follows ACR consensus guidelines: Managing Incidental Findings on Abdominal CT: White Paper of the ACR Incidental Findings Committee. Alba DestineJ Am Coll Radiol [phone];7:[id number]   Electronically Signed   By: Myles RosenthalJohn  Stahl M.D.   On: 03/14/2014 17:36     EKG Interpretation None      MDM   Final diagnoses:  Nausea vomiting and diarrhea  Liver lesion   Lower abdominal pain with vomiting, diarrhea, fever. Most likely viral gastroenteritis but there is  Some tenderness in RLQ.    Labs show no leukocytosis. CT scan does not visualize the appendix but no inflammation in the right lower quadrant. 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 Informed he will need followup with MRI.  Tolerating by mouth in the ED  no vomiting. No diarrhea. Supportive care for suspected viral syndrome. By mouth hydration at home with antibiotics. Followup with PCP for liver abnormality. Return precautions discussed.  BP 114/65  Pulse 62  Temp(Src) 98.4 F (36.9 C) (Oral)  Resp 20  Ht 5\' 10"  (1.778 m)  Wt 210 lb (95.255 kg)  BMI 30.13 kg/m2  SpO2 100%   Glynn Octave, MD 03/14/14 2353

## 2014-03-14 NOTE — ED Notes (Signed)
Pt c/o abd pain/n/v/d x 2 days.  

## 2014-11-15 ENCOUNTER — Encounter (HOSPITAL_COMMUNITY): Payer: Self-pay | Admitting: *Deleted

## 2014-11-15 ENCOUNTER — Emergency Department (HOSPITAL_COMMUNITY)
Admission: EM | Admit: 2014-11-15 | Discharge: 2014-11-16 | Disposition: A | Discharge status: Home / Self Care | Payer: Medicaid Other | Attending: Emergency Medicine | Admitting: Emergency Medicine

## 2014-11-15 DIAGNOSIS — Z8719 Personal history of other diseases of the digestive system: Secondary | ICD-10-CM | POA: Insufficient documentation

## 2014-11-15 DIAGNOSIS — N508 Other specified disorders of male genital organs: Secondary | ICD-10-CM | POA: Insufficient documentation

## 2014-11-15 DIAGNOSIS — Z8709 Personal history of other diseases of the respiratory system: Secondary | ICD-10-CM | POA: Diagnosis not present

## 2014-11-15 DIAGNOSIS — Z720 Tobacco use: Secondary | ICD-10-CM | POA: Diagnosis not present

## 2014-11-15 DIAGNOSIS — N4889 Other specified disorders of penis: Secondary | ICD-10-CM | POA: Diagnosis not present

## 2014-11-15 DIAGNOSIS — Z8659 Personal history of other mental and behavioral disorders: Secondary | ICD-10-CM | POA: Diagnosis not present

## 2014-11-15 NOTE — ED Notes (Signed)
Penile pain since 2 pm. Has had similar sx  Recently with intermittent pain

## 2014-11-16 ENCOUNTER — Other Ambulatory Visit (HOSPITAL_COMMUNITY): Payer: Self-pay | Admitting: Emergency Medicine

## 2014-11-16 ENCOUNTER — Ambulatory Visit (HOSPITAL_COMMUNITY)
Admit: 2014-11-16 | Discharge: 2014-11-16 | Disposition: A | Discharge status: Home / Self Care | Payer: Medicaid Other | Attending: Emergency Medicine | Admitting: Emergency Medicine

## 2014-11-16 DIAGNOSIS — N4889 Other specified disorders of penis: Secondary | ICD-10-CM | POA: Insufficient documentation

## 2014-11-16 DIAGNOSIS — N508 Other specified disorders of male genital organs: Secondary | ICD-10-CM | POA: Insufficient documentation

## 2014-11-16 DIAGNOSIS — Z8043 Family history of malignant neoplasm of testis: Secondary | ICD-10-CM

## 2014-11-16 LAB — URINALYSIS, ROUTINE W REFLEX MICROSCOPIC
Bilirubin Urine: NEGATIVE
Glucose, UA: NEGATIVE mg/dL
Hgb urine dipstick: NEGATIVE
Ketones, ur: NEGATIVE mg/dL
Leukocytes, UA: NEGATIVE
NITRITE: NEGATIVE
Protein, ur: NEGATIVE mg/dL
Specific Gravity, Urine: 1.025 (ref 1.005–1.030)
Urobilinogen, UA: 0.2 mg/dL (ref 0.0–1.0)
pH: 6.5 (ref 5.0–8.0)

## 2014-11-16 MED ORDER — CIPROFLOXACIN HCL 500 MG PO TABS: 500.0000 mg | ORAL_TABLET | Freq: Two times a day (BID) | ORAL | Status: DC

## 2014-11-16 NOTE — ED Provider Notes (Signed)
CSN: [id number]     Arrival date & time 11/15/14  2216 History   First MD Initiated Contact with Patient 11/15/14 2329     Chief Complaint  Patient presents with  . Penis Pain     (Consider location/radiation/quality/duration/timing/severity/associated sxs/prior Treatment) HPI   Kurt Gillespie is a 29 y.o. male who presents to the Emergency Department complaining of intermittent pain to the base of his penis for several weeks.  He describes a dull aching pain that intensifies then subsides and resolves.  Pain has been increasing and more persistent for 8-9 hrs prior to ed arrival.  He states pain feels like at times that it radiates to his left testicle.  He denies pain or burning with urination, urinary frequency, penile discharge, pain with ejaculation or bloody semen.  No recent abdominal pain, fever, chills, back pain or vomiting  Past Medical History  Diagnosis Date  . Bipolar 1 disorder   . Bronchitis     as a child  . GERD (gastroesophageal reflux disease)    Past Surgical History  Procedure Laterality Date  . Wisdom tooth extraction  1999    age 29 years  . Multiple extractions with alveoloplasty N/A 09/17/2013    Procedure: MULTIPLE EXTRACION WITH ALVEOLOPLASTY;  Surgeon: Georgia LopesScott M Jensen, DDS;  Location: [facility] OR;  Service: Oral Surgery;  Laterality: N/A;   History reviewed. No pertinent family history. History  Substance Use Topics  . Smoking status: Current Every Day Smoker -- 0.50 packs/day for 10 years    Types: Cigarettes    Last Attempt to Quit: 01/12/2014  . Smokeless tobacco: Never Used  . Alcohol Use: No     Comment: occasional    Review of Systems  Constitutional: Negative for fever, activity change and appetite change.  Respiratory: Negative for shortness of breath.   Cardiovascular: Negative for chest pain.  Gastrointestinal: Negative for nausea, vomiting and abdominal pain.  Genitourinary: Positive for penile pain and testicular pain. Negative for dysuria,  frequency, hematuria, flank pain, decreased urine volume, discharge, penile swelling, scrotal swelling, difficulty urinating and genital sores.  Musculoskeletal: Negative for back pain.  Skin: Negative for rash.  Neurological: Negative for dizziness, weakness and numbness.  All other systems reviewed and are negative.     Allergies  Review of patient's allergies indicates no known allergies.  Home Medications   Prior to Admission medications   Medication Sig Start Date End Date Taking? Authorizing Provider  acetaminophen (TYLENOL) 500 MG tablet Take 1,000 mg by mouth every 8 (eight) hours as needed. pain    Historical Provider, MD  ibuprofen (ADVIL,MOTRIN) 200 MG tablet Take 200-800 mg by mouth every 6 (six) hours as needed for pain.     Historical Provider, MD  ondansetron (ZOFRAN) 4 MG tablet Take 1 tablet (4 mg total) by mouth every 6 (six) hours. 03/14/14   Glynn OctaveStephen Rancour, MD   BP 131/88 mmHg  Pulse 97  Temp(Src) 99.1 F (37.3 C) (Oral)  Resp 20  Ht 5\' 11"  (1.803 m)  Wt 220 lb (99.791 kg)  BMI 30.70 kg/m2  SpO2 99% Physical Exam  Constitutional: He is oriented to person, place, and time. He appears well-developed and well-nourished. No distress.  Cardiovascular: Normal rate, regular rhythm, normal heart sounds and intact distal pulses.   No murmur heard. Pulmonary/Chest: Effort normal and breath sounds normal. No respiratory distress.  Abdominal: Soft. He exhibits no distension. There is no tenderness. There is no rebound and no guarding. Hernia confirmed negative in the  left inguinal area.  Genitourinary: Penis normal. Cremasteric reflex is present. Left testis shows no mass, no swelling and no tenderness. Left testis is descended. Cremasteric reflex is not absent on the left side. Circumcised. No phimosis, paraphimosis, penile erythema or penile tenderness. No discharge found.  Patient reports pain at the base of his penis.  Examination is unremarkable.  No testicular masses  or obvious tenderness to palpation.  No inguinal hernias. No urethral d/c   Musculoskeletal: Normal range of motion.  Lymphadenopathy:       Left: No inguinal adenopathy present.  Neurological: He is alert and oriented to person, place, and time. Coordination normal.  Skin: Skin is warm and dry.  Psychiatric: He has a normal mood and affect.  Nursing note and vitals reviewed.   ED Course  Procedures (including critical care time) Labs Review Labs Reviewed  URINALYSIS, ROUTINE W REFLEX MICROSCOPIC  RPR  GC/CHLAMYDIA PROBE AMP ([place])    Imaging Review No results found.   EKG Interpretation None     GC, RPR and chlamydia results pending.   MDM   Final diagnoses:  Penis pain    Pt scheduled to have outpatient US scrotum tomorrow morning 11/16/14 at 9:30 am.  Pt is well appearing and non-toxic.  Clinical suspicion for testicular torsion is low, possible epidymidis so I will start Cipro, culture are pending.  Pt appears stable for d/c and agrees to plan.    Severiano Gilbert, PA-C 11/17/14 1739  Devoria Albe, MD 11/28/14 2302

## 2014-11-18 LAB — GC/CHLAMYDIA PROBE AMP (~~LOC~~) NOT AT ARMC
Chlamydia: NEGATIVE
Neisseria Gonorrhea: NEGATIVE

## 2015-10-05 ENCOUNTER — Encounter (HOSPITAL_COMMUNITY): Payer: Self-pay

## 2015-10-05 ENCOUNTER — Emergency Department (HOSPITAL_COMMUNITY)
Admission: EM | Admit: 2015-10-05 | Discharge: 2015-10-05 | Disposition: A | Discharge status: Home / Self Care | Payer: Medicaid Other | Attending: Emergency Medicine | Admitting: Emergency Medicine

## 2015-10-05 DIAGNOSIS — R112 Nausea with vomiting, unspecified: Secondary | ICD-10-CM | POA: Insufficient documentation

## 2015-10-05 DIAGNOSIS — F17210 Nicotine dependence, cigarettes, uncomplicated: Secondary | ICD-10-CM | POA: Diagnosis not present

## 2015-10-05 DIAGNOSIS — Z8659 Personal history of other mental and behavioral disorders: Secondary | ICD-10-CM | POA: Diagnosis not present

## 2015-10-05 DIAGNOSIS — Z8719 Personal history of other diseases of the digestive system: Secondary | ICD-10-CM | POA: Insufficient documentation

## 2015-10-05 DIAGNOSIS — J029 Acute pharyngitis, unspecified: Secondary | ICD-10-CM | POA: Insufficient documentation

## 2015-10-05 LAB — RAPID STREP SCREEN (MED CTR MEBANE ONLY): Streptococcus, Group A Screen (Direct): NEGATIVE

## 2015-10-05 MED ORDER — DEXAMETHASONE SODIUM PHOSPHATE 10 MG/ML IJ SOLN
10.0000 mg | Freq: Once | INTRAMUSCULAR | Status: AC
  Administered 2015-10-05: 10 mg via INTRAVENOUS
  Filled 2015-10-05: qty 1

## 2015-10-05 MED ORDER — SODIUM CHLORIDE 0.9 % IV SOLN
1000.0000 mL | Freq: Once | INTRAVENOUS | Status: AC
  Administered 2015-10-05: 1000 mL via INTRAVENOUS

## 2015-10-05 MED ORDER — SODIUM CHLORIDE 0.9 % IV SOLN
1000.0000 mL | INTRAVENOUS | Status: DC
  Administered 2015-10-05: 1000 mL via INTRAVENOUS

## 2015-10-05 MED ORDER — KETOROLAC TROMETHAMINE 30 MG/ML IJ SOLN
30.0000 mg | Freq: Once | INTRAMUSCULAR | Status: AC
  Administered 2015-10-05: 30 mg via INTRAVENOUS
  Filled 2015-10-05: qty 1

## 2015-10-05 NOTE — ED Notes (Signed)
PT states understanding of care given and follow up instructions.   

## 2015-10-05 NOTE — ED Provider Notes (Signed)
CSN: [id number]     Arrival date & time 10/05/15  0056 History   First MD Initiated Contact with Patient 10/05/15 0330     Chief Complaint  Patient presents with  . Influenza     (Consider location/radiation/quality/duration/timing/severity/associated sxs/prior Treatment) Patient is a 30 y.o. male presenting with flu symptoms. The history is provided by the patient.  Influenza He states that he has been sick for the last 2 days with sore throat, cough, generalized body aches. There's been intermittent nausea and vomiting. He has had sick contacts that his wife and daughter have been diagnosed with influenza. He is not anything to treat his symptoms at home.  Past Medical History  Diagnosis Date  . Bipolar 1 disorder (HCC)   . Bronchitis     as a child  . GERD (gastroesophageal reflux disease)    Past Surgical History  Procedure Laterality Date  . Wisdom tooth extraction  1999    age 43 years  . Multiple extractions with alveoloplasty N/A 09/17/2013    Procedure: MULTIPLE EXTRACION WITH ALVEOLOPLASTY;  Surgeon: Georgia Lopes, DDS;  Location: [facility] OR;  Service: Oral Surgery;  Laterality: N/A;   No family history on file. Social History  Substance Use Topics  . Smoking status: Current Every Day Smoker -- 0.50 packs/day for 10 years    Types: Cigarettes    Last Attempt to Quit: 01/12/2014  . Smokeless tobacco: Never Used  . Alcohol Use: No     Comment: occasional    Review of Systems  All other systems reviewed and are negative.     Allergies  Review of patient's allergies indicates no known allergies.  Home Medications   Prior to Admission medications   Medication Sig Start Date End Date Taking? Authorizing Provider  acetaminophen (TYLENOL) 500 MG tablet Take 1,000 mg by mouth every 8 (eight) hours as needed. pain    Historical Provider, MD  ciprofloxacin (CIPRO) 500 MG tablet Take 1 tablet (500 mg total) by mouth 2 (two) times daily. For 10 days 11/16/14   Tammy  Triplett, PA-C  ibuprofen (ADVIL,MOTRIN) 200 MG tablet Take 200-800 mg by mouth every 6 (six) hours as needed for pain.     Historical Provider, MD  ondansetron (ZOFRAN) 4 MG tablet Take 1 tablet (4 mg total) by mouth every 6 (six) hours. 03/14/14   Glynn Octave, MD   BP 142/106 mmHg  Pulse 100  Temp(Src) 98.9 F (37.2 C) (Oral)  Resp 20  Ht  (1.803 m)  Wt 240 lb (108.863 kg)  BMI 33.49 kg/m2  SpO2 98% Physical Exam  Nursing note and vitals reviewed.  30 year old male, resting comfortably and in no acute distress. Vital signs are significant for hypertension. Oxygen saturation is 98%, which is normal. Head is normocephalic and atraumatic. PERRLA, EOMI. Oropharynx is mildly erythematous without exudate. There is no pooling of secretions and phonation is normal. Neck is nontender and supple without adenopathy or JVD. Back is nontender and there is no CVA tenderness. Lungs are clear without rales, wheezes, or rhonchi. Chest is nontender. Heart has regular rate and rhythm without murmur. Abdomen is soft, flat, nontender without masses or hepatosplenomegaly and peristalsis is hypoactive. Extremities have no cyanosis or edema, full range of motion is present. Skin is warm and dry without rash. Neurologic: Mental status is normal, cranial nerves are intact, there are no motor or sensory deficits.  ED Course  Procedures (including critical care time) Labs Review Results for orders placed  or performed during the hospital encounter of 10/05/15  Rapid strep screen (not at [facility])  Result Value Ref Range   Streptococcus, Group A Screen (Direct) NEGATIVE NEGATIVE   I have personally reviewed and evaluated these images and lab results as part of my medical decision-making.    MDM   Final diagnoses:  Viral pharyngitis    Viral illness with pharyngitis being the most prominent feature. Strep screen is obtained and is negative. Is given IV fluids as well as dexamethasone and  ketorolac and he feels much better following this. He is discharged with instructions to continue aggressive hydration at home and use over-the-counter analgesics as needed.    Dione Booze, MD 10/05/15 [phone]

## 2015-10-05 NOTE — ED Notes (Signed)
My wife and daughter have the flu. I have been having a cough, sore throat, body aches, fever per pt. Also having nausea and vomiting.

## 2015-10-05 NOTE — Discharge Instructions (Signed)

## 2015-10-08 LAB — CULTURE, GROUP A STREP (THRC)

## 2016-04-14 IMAGING — CT CT ABD-PELV W/ CM
2 of 4 series · 16 of 46 positions shown, 18 images · IV contrast (omnipaque)
Comparison: None.

CLINICAL DATA: Right lower quadrant pain.  nausea and vomiting.

EXAM:
CT ABDOMEN AND PELVIS WITH CONTRAST
TECHNIQUE: Multidetector CT imaging of the abdomen and pelvis was performed
using the standard protocol following bolus administration of
intravenous contrast.
CONTRAST:  100mL OMNIPAQUE IOHEXOL 300 MG/ML  SOLN

[Series 2: abd_pel_with 5.0 b40f · axial · 0.70mm/px · z∈[-555,-75]mm · 13 of 106 slices shown, 15 images]
[im 5/106  soft-tissue]
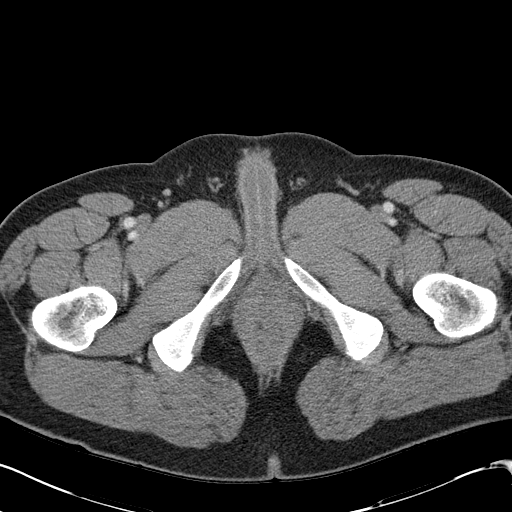
[im 5/106  bone]
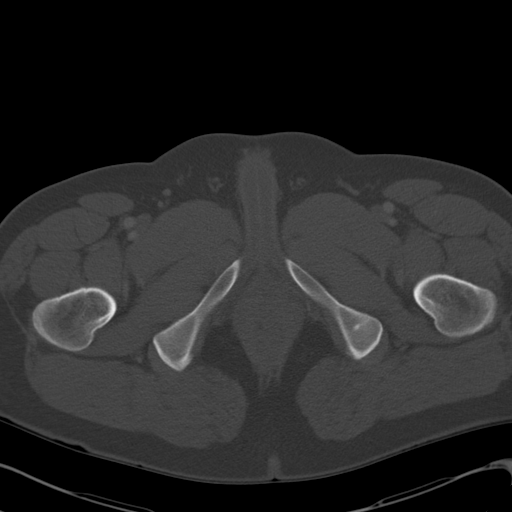
[im 13/106  soft-tissue]
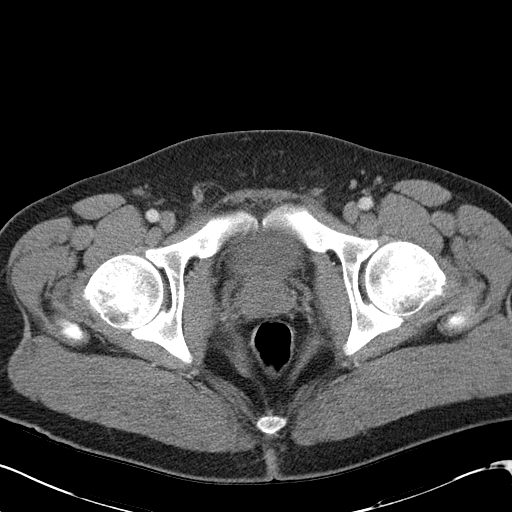
[im 22/106  soft-tissue]
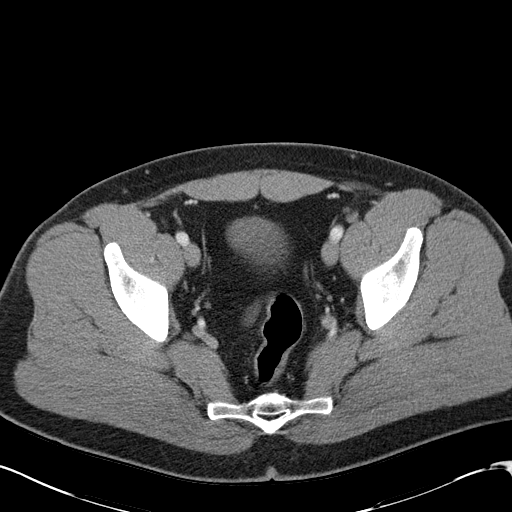
[im 30/106  soft-tissue]
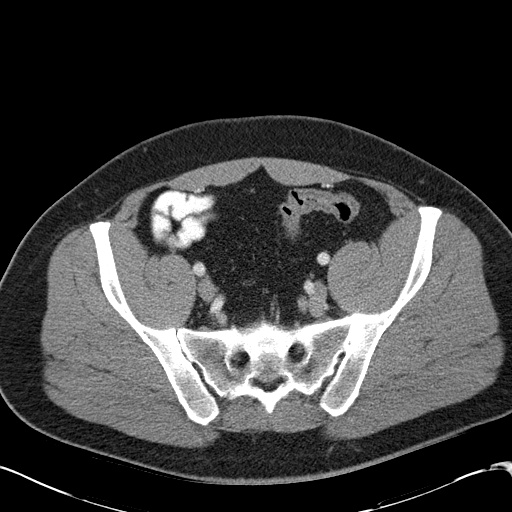
[im 38/106  soft-tissue]
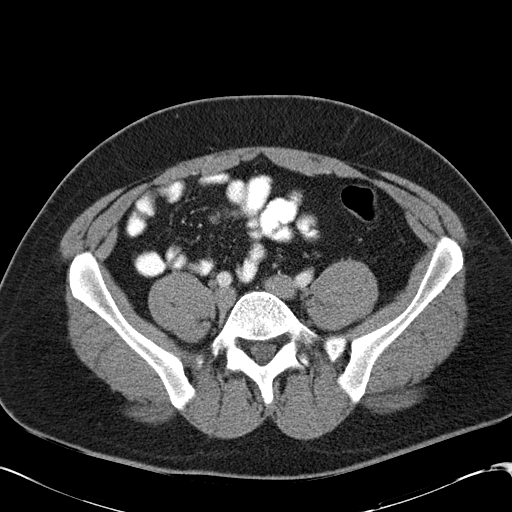
[im 47/106  soft-tissue]
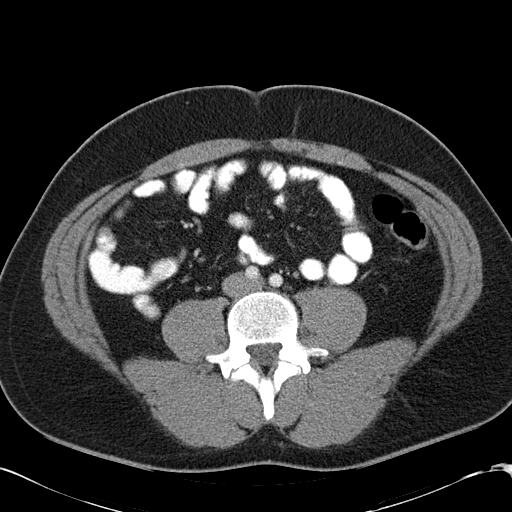
[im 55/106  soft-tissue]
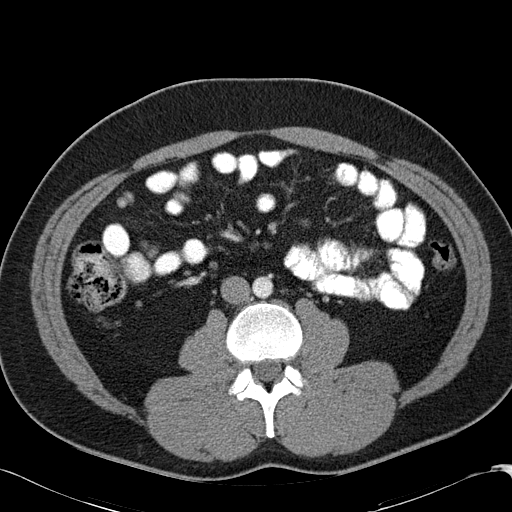
[im 59/106  soft-tissue]
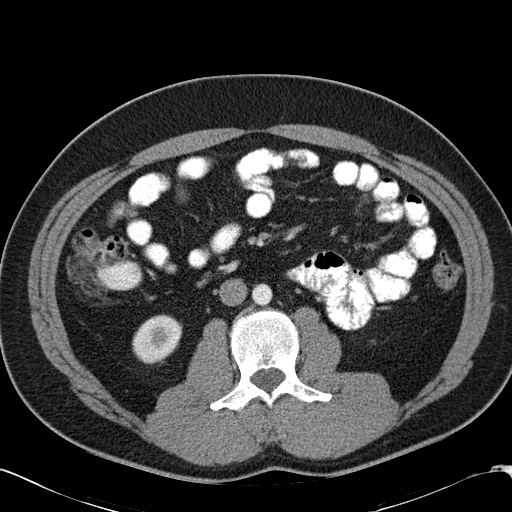
[im 68/106  soft-tissue]
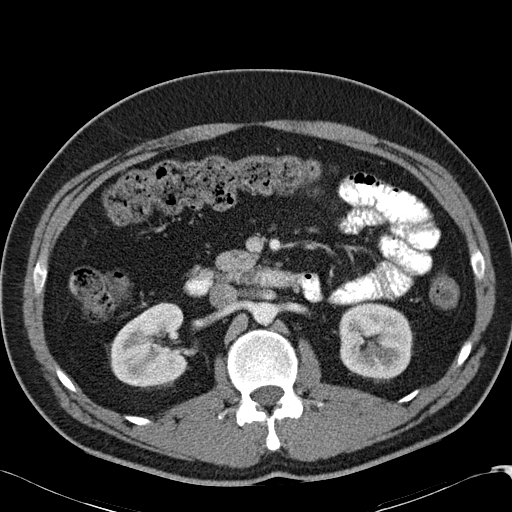
[im 68/106  bone]
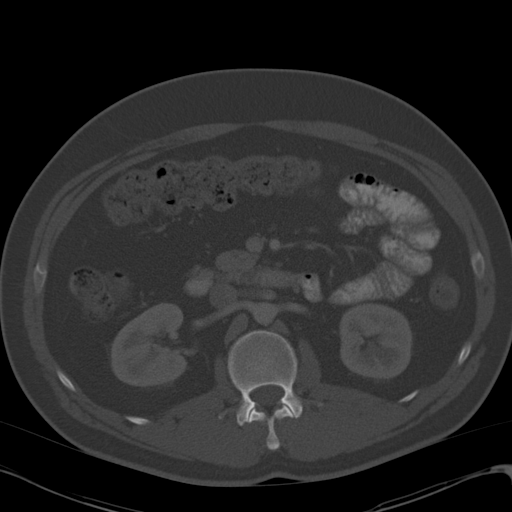
[im 76/106  soft-tissue]
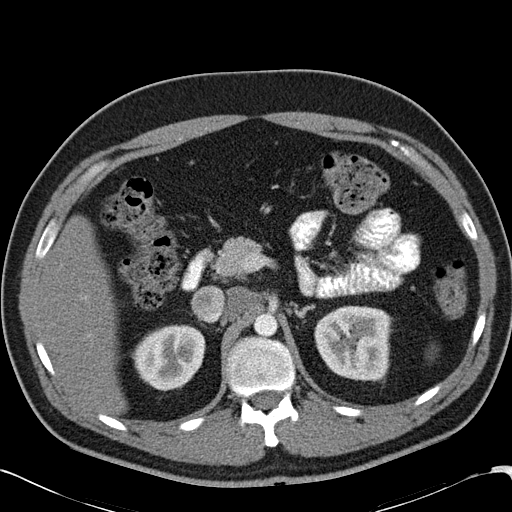
[im 85/106  soft-tissue]
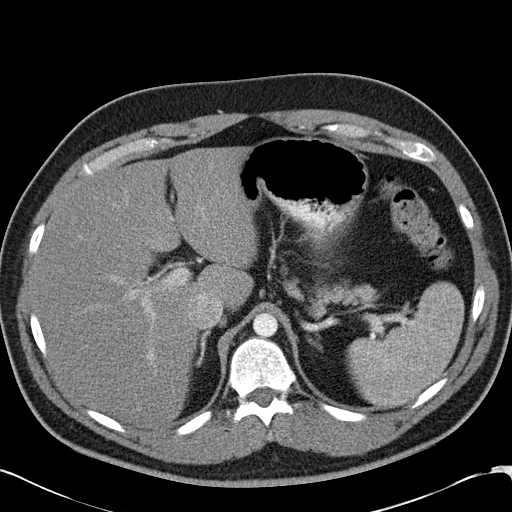
[im 93/106  soft-tissue]
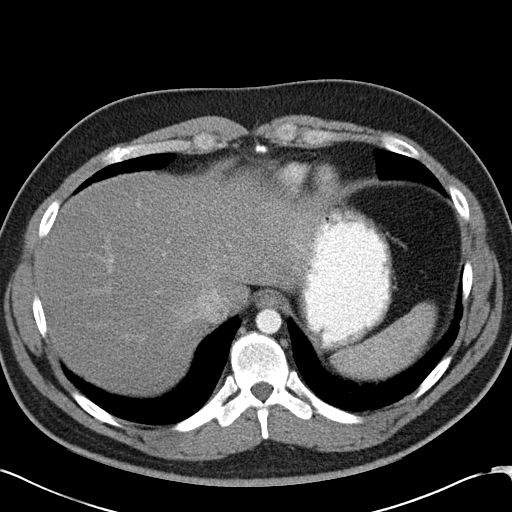
[im 101/106  soft-tissue]
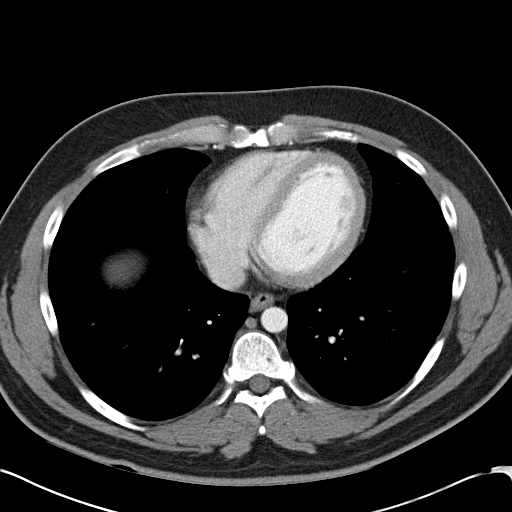

[Series 3: abd_pel_with 3.0 spo · coronal · 0.77mm/px · 3 of 99 slices shown]
[im 33/99  soft-tissue]
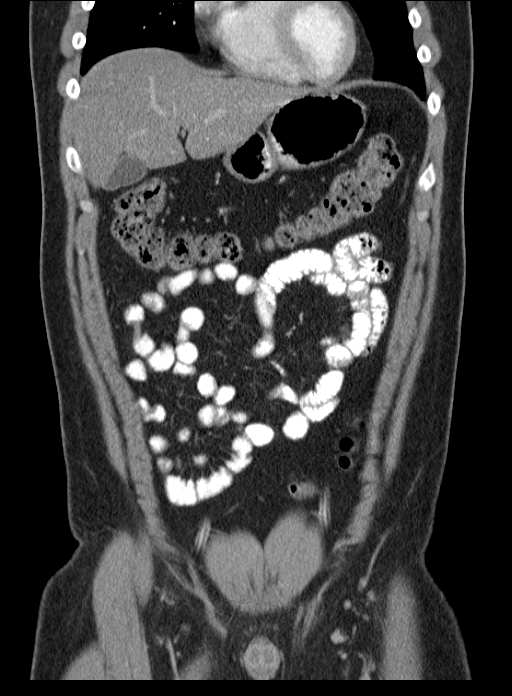
[im 44/99  soft-tissue]
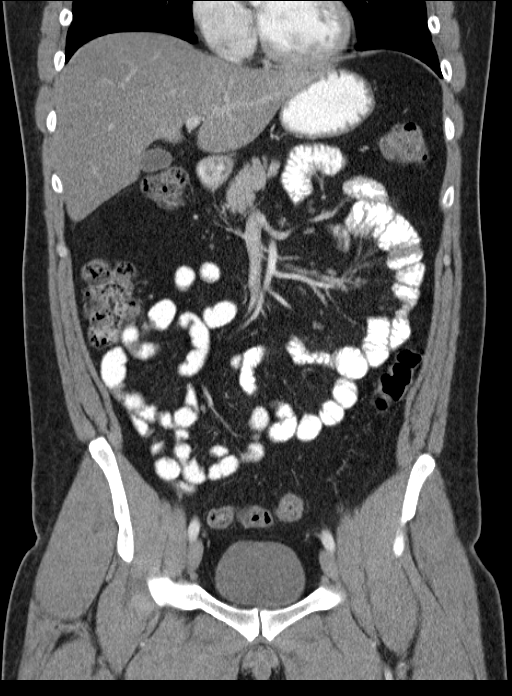
[im 55/99  soft-tissue]
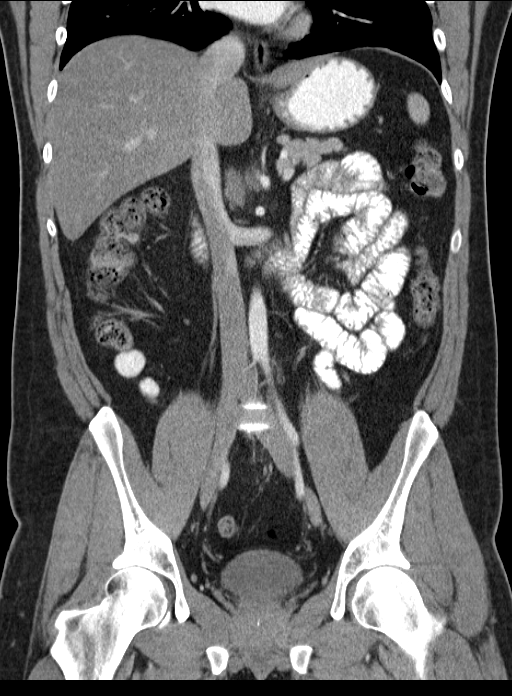

[16 of 46 positions shown; findings below may reference images not displayed]

FINDINGS: Liver: Hepatic steatosis demonstrated. A 1.3 cm flash filling
hypervascular lesion is seen in the superior left hepatic lobe on
image 12 of series 2. This most likely represents a benign
hemangioma or focal nodular hyperplasia. No other liver lesions are
identified.

Gallbladder:  Unremarkable.

Pancreas: No mass, inflammatory changes, or other parenchymal
abnormality identified.

Spleen:  Within normal limits in size and appearance.

Adrenal Glands:  No mass identified.

Kidneys/Urinary Tract: No masses identified. No evidence of
hydronephrosis.

Bowel: No evidence of wall thickening, mass, or obstruction.
Appendix not well visualized, however there is no evidence of
inflammatory process in the area the cecum or elsewhere within the
abdomen or pelvis.

Lymph Nodes:  No pathologically enlarged lymph nodes identified.

Pelvic/Reproductive Organs: No mass or other abnormality identified.

Vascular:  No evidence of abdominal aortic aneurysm.

Musculoskeletal:  No suspicious bone lesions identified.

Other:  None.
IMPRESSION: No acute findings.

Hepatic steatosis.

Small flash filling hypervascular lesion in the left hepatic lobe.
In the absence of risk factors for hepatic malignancy, this most
likely represents a benign hemangioma or focal nodular hyperplasia.
If patient does have risk factors for hepatic malignancy, further
evaluation with abdomen MRI or follow-up CT is recommended. This
recommendation follows ACR consensus guidelines: Managing Incidental
Findings on Abdominal CT: White Paper of the ACR Incidental Findings
Committee. [HOSPITAL] [id number];[DATE]

## 2018-04-11 ENCOUNTER — Encounter (HOSPITAL_COMMUNITY): Payer: Self-pay | Admitting: Emergency Medicine

## 2018-04-11 ENCOUNTER — Emergency Department (HOSPITAL_COMMUNITY): Payer: Medicaid Other

## 2018-04-11 ENCOUNTER — Other Ambulatory Visit: Payer: Self-pay

## 2018-04-11 ENCOUNTER — Emergency Department (HOSPITAL_COMMUNITY)
Admission: EM | Admit: 2018-04-11 | Discharge: 2018-04-11 | Disposition: A | Discharge status: Home / Self Care | Payer: Medicaid Other | Attending: Emergency Medicine | Admitting: Emergency Medicine

## 2018-04-11 DIAGNOSIS — Z79899 Other long term (current) drug therapy: Secondary | ICD-10-CM | POA: Diagnosis not present

## 2018-04-11 DIAGNOSIS — F17210 Nicotine dependence, cigarettes, uncomplicated: Secondary | ICD-10-CM | POA: Diagnosis not present

## 2018-04-11 DIAGNOSIS — N200 Calculus of kidney: Secondary | ICD-10-CM | POA: Diagnosis not present

## 2018-04-11 DIAGNOSIS — R319 Hematuria, unspecified: Secondary | ICD-10-CM | POA: Diagnosis present

## 2018-04-11 HISTORY — DX: Calculus of kidney: N20.0

## 2018-04-11 LAB — URINALYSIS, ROUTINE W REFLEX MICROSCOPIC
Bilirubin Urine: NEGATIVE
GLUCOSE, UA: NEGATIVE mg/dL
Ketones, ur: NEGATIVE mg/dL
Leukocytes, UA: NEGATIVE
Nitrite: NEGATIVE
PROTEIN: 30 mg/dL — AB
RBC / HPF: >50 RBC/hpf — ABNORMAL HIGH (ref 0–5)
SPECIFIC GRAVITY, URINE: 1.027 (ref 1.005–1.030)
pH: 5 (ref 5.0–8.0)

## 2018-04-11 LAB — CBC
HCT: 40.4 % (ref 39.0–52.0)
Hemoglobin: 13.7 g/dL (ref 13.0–17.0)
MCH: 28.6 pg (ref 26.0–34.0)
MCHC: 33.9 g/dL (ref 30.0–36.0)
MCV: 84.3 fL (ref 78.0–100.0)
PLATELETS: 245 10*3/uL (ref 150–400)
RBC: 4.79 MIL/uL (ref 4.22–5.81)
RDW: 12.9 % (ref 11.5–15.5)
WBC: 8.9 10*3/uL (ref 4.0–10.5)

## 2018-04-11 LAB — BASIC METABOLIC PANEL
Anion gap: 7 (ref 5–15)
BUN: 16 mg/dL (ref 6–20)
CALCIUM: 8.8 mg/dL — AB (ref 8.9–10.3)
CO2: 26 mmol/L (ref 22–32)
Chloride: 104 mmol/L (ref 98–111)
Creatinine, Ser: 1.08 mg/dL (ref 0.61–1.24)
GFR calc Af Amer: >60 mL/min (ref >60)
GFR calc non Af Amer: >60 mL/min (ref >60)
Glucose, Bld: 95 mg/dL (ref 70–99)
Potassium: 3.9 mmol/L (ref 3.5–5.1)
Sodium: 137 mmol/L (ref 135–145)

## 2018-04-11 MED ORDER — KETOROLAC TROMETHAMINE 60 MG/2ML IM SOLN
60.0000 mg | Freq: Once | INTRAMUSCULAR | Status: AC
  Administered 2018-04-11: 60 mg via INTRAMUSCULAR
  Filled 2018-04-11: qty 2

## 2018-04-11 MED ORDER — IBUPROFEN 800 MG PO TABS: 800.0000 mg | ORAL_TABLET | Freq: Three times a day (TID) | ORAL | 0 refills | Status: DC

## 2018-04-11 MED ORDER — ONDANSETRON 4 MG PO TBDP: 4.0000 mg | ORAL_TABLET | Freq: Three times a day (TID) | ORAL | 0 refills | Status: DC | PRN

## 2018-04-11 MED ORDER — IBUPROFEN 800 MG PO TABS: 800.0000 mg | ORAL_TABLET | Freq: Three times a day (TID) | ORAL | 0 refills | Status: AC

## 2018-04-11 MED ORDER — TAMSULOSIN HCL 0.4 MG PO CAPS: 0.4000 mg | ORAL_CAPSULE | Freq: Every day | ORAL | 0 refills | Status: DC

## 2018-04-11 NOTE — ED Triage Notes (Signed)
Pt reports R sided flank pain with hematuria. Hx of kidney stones and states it feels the same. No OTC medications today.

## 2018-04-12 NOTE — ED Provider Notes (Signed)
[facility] EMERGENCY DEPARTMENT Provider Note   CSN: [id number] Arrival date & time: 04/11/18  1856     History   Chief Complaint Chief Complaint  Patient presents with  . Hematuria    HPI Kurt Gillespie is a 32 y.o. male.  The history is provided by the patient. No language interpreter was used.  Hematuria  This is a new problem. The current episode started 2 days ago. The problem occurs constantly. The problem has been gradually worsening. Associated symptoms include abdominal pain. Nothing aggravates the symptoms. Nothing relieves the symptoms. He has tried nothing for the symptoms. The treatment provided no relief.   Pt complains of right lower abdominal pain,  Pt reports urine looks dark.  Pt has had similar with kidney stones in the past, Past Medical History:  Diagnosis Date  . Bipolar 1 disorder (HCC)   . Bronchitis    as a child  . GERD (gastroesophageal reflux disease)   . Nephrolithiasis     There are no active problems to display for this patient.   Past Surgical History:  Procedure Laterality Date  . MULTIPLE EXTRACTIONS WITH ALVEOLOPLASTY N/A 09/17/2013   Procedure: MULTIPLE EXTRACION WITH ALVEOLOPLASTY;  Surgeon: Georgia Lopes, DDS;  Location: [facility] OR;  Service: Oral Surgery;  Laterality: N/A;  . WISDOM TOOTH EXTRACTION  1999   age 43 years        Home Medications    Prior to Admission medications   Medication Sig Start Date End Date Taking? Authorizing Provider  acetaminophen (TYLENOL) 500 MG tablet Take 1,000 mg by mouth every 8 (eight) hours as needed. pain    [provider]  ciprofloxacin (CIPRO) 500 MG tablet Take 1 tablet (500 mg total) by mouth 2 (two) times daily. For 10 days 11/16/14   Triplett, Tammy, PA-C  ibuprofen (ADVIL,MOTRIN) 800 MG tablet Take 1 tablet (800 mg total) by mouth 3 (three) times daily. 04/11/18   Elson Areas, PA-C  ondansetron (ZOFRAN ODT) 4 MG disintegrating tablet Take 1 tablet (4 mg total) by mouth every 8  (eight) hours as needed for nausea or vomiting. 04/11/18   Elson Areas, PA-C  tamsulosin (FLOMAX) 0.4 MG CAPS capsule Take 1 capsule (0.4 mg total) by mouth daily. 04/11/18   Elson Areas, PA-C    Family History History reviewed. No pertinent family history.  Social History Social History   Tobacco Use  . Smoking status: Current Every Day Smoker    Packs/day: 0.50    Years: 10.00    Pack years: 5.00    Types: Cigarettes  . Smokeless tobacco: Never Used  Substance Use Topics  . Alcohol use: No    Comment: occasional  . Drug use: No     Allergies   Patient has no known allergies.   Review of Systems Review of Systems  Gastrointestinal: Positive for abdominal pain.  Genitourinary: Positive for hematuria.  All other systems reviewed and are negative.    Physical Exam Updated Vital Signs BP 113/73 (BP Location: Right Arm)   Pulse 61   Temp 98.2 F (36.8 C) (Oral)   Resp 14   SpO2 100%   Physical Exam  Constitutional: He appears well-developed and well-nourished.  HENT:  Head: Normocephalic and atraumatic.  Eyes: Conjunctivae are normal.  Neck: Neck supple.  Cardiovascular: Normal rate and regular rhythm.  No murmur heard. Pulmonary/Chest: Effort normal and breath sounds normal. No respiratory distress.  Abdominal: Soft. There is no tenderness.  Musculoskeletal: He exhibits  no edema.  Neurological: He is alert.  Skin: Skin is warm and dry.  Psychiatric: He has a normal mood and affect.  Nursing note and vitals reviewed.    ED Treatments / Results  Labs (all labs ordered are listed, but only abnormal results are displayed) Labs Reviewed  URINALYSIS, ROUTINE W REFLEX MICROSCOPIC - Abnormal; Notable for the following components:      Result Value   Color, Urine AMBER (*)    APPearance CLOUDY (*)    Hgb urine dipstick LARGE (*)    Protein, ur 30 (*)    RBC / HPF >50 (*)    Bacteria, UA RARE (*)    All other components within normal limits  BASIC  METABOLIC PANEL - Abnormal; Notable for the following components:   Calcium 8.8 (*)    All other components within normal limits  URINE CULTURE  CBC    EKG None  Radiology Ct Renal Stone Study  Result Date: 04/11/2018 CLINICAL DATA:  32 year old male with history of right-sided flank pain and hematuria today. EXAM: CT ABDOMEN AND PELVIS WITHOUT CONTRAST TECHNIQUE: Multidetector CT imaging of the abdomen and pelvis was performed following the standard protocol without IV contrast. COMPARISON:  CT the abdomen and pelvis 03/14/2014. FINDINGS: Lower chest: Unremarkable. Hepatobiliary: Diffuse low attenuation noted throughout the hepatic parenchyma, indicative of hepatic steatosis. No definite cystic or solid hepatic lesions are confidently identified on today's noncontrast CT examination. Unenhanced appearance of the gallbladder is normal. Pancreas: No definite pancreatic mass or peripancreatic fluid or inflammatory changes are noted on today's noncontrast CT examination. Spleen: Unremarkable. Adrenals/Urinary Tract: 2 mm calculus in the distal third of the right ureter shortly before the right ureterovesicular junction. This is associated with mild proximal hydroureteronephrosis. No additional calculi are noted within the collecting system of the left kidney, along the course of the left ureter, or within the lumen of the urinary bladder. Unenhanced appearance of the kidneys is otherwise unremarkable bilaterally. Bilateral adrenal glands are normal in appearance. Urinary bladder is normal in appearance. Stomach/Bowel: Normal appearance of the stomach. No pathologic dilatation of small bowel or colon. Normal appendix. Vascular/Lymphatic: No atherosclerotic calcifications are noted in the abdominal aorta or pelvic vasculature. In the retroperitoneum between the origin of the celiac axis and the IVC (axial image 28 of series 2) there is a well-defined 3.9 x 2.0 cm soft tissue attenuation mass which is similar  in retrospect to prior study from 2015, favored to represent either a benign enlarged lymph node, or other benign lesions such as a lymphangioma. No other lymphadenopathy noted elsewhere in the abdomen or pelvis. Reproductive: Prostate gland and seminal vesicles are unremarkable in appearance. Other: Small right inguinal hernia containing only fat. No significant volume of ascites. No pneumoperitoneum. Musculoskeletal: There are no aggressive appearing lytic or blastic lesions noted in the visualized portions of the skeleton. IMPRESSION: 1. 2 mm calculus in the distal third of the right ureter shortly before the right ureterovesicular junction with mild right hydroureteronephrosis. 2. Severe hepatic steatosis. 3. Additional incidental findings, as above. Electronically Signed   By: Trudie Reed M.D.   On: 04/11/2018 21:07    Procedures Procedures (including critical care time)  Medications Ordered in ED Medications  ketorolac (TORADOL) injection 60 mg (60 mg Intramuscular Given 04/11/18 2037)     Initial Impression / Assessment and Plan / ED Course  I have reviewed the triage vital signs and the nursing notes.  Pertinent labs & imaging results that were available during  my care of the patient were reviewed by me and considered in my medical decision making (see chart for details).     MDM  Ct reviewed and discussed with pt.  Pt given torodol IM.  Pt declines narcotic pain medication.  Pt has a substance abuse history and avoids.  Pt advised to follow up with Urology for evaluation   Final Clinical Impressions(s) / ED Diagnoses   Final diagnoses:  Kidney stone    ED Discharge Orders         Ordered    tamsulosin (FLOMAX) 0.4 MG CAPS capsule  Daily     04/11/18 2151    ibuprofen (ADVIL,MOTRIN) 800 MG tablet  3 times daily,   Status:  Discontinued     04/11/18 2151    ondansetron (ZOFRAN ODT) 4 MG disintegrating tablet  Every 8 hours PRN,   Status:  Discontinued     04/11/18 2151     ibuprofen (ADVIL,MOTRIN) 800 MG tablet  3 times daily     04/11/18 2153    ondansetron (ZOFRAN ODT) 4 MG disintegrating tablet  Every 8 hours PRN     04/11/18 2153        An After Visit Summary was printed and given to the patient.    Elson Areas, [place] 04/12/18 [id number]    Maia Plan, MD 04/12/18 [id number]

## 2018-04-13 LAB — URINE CULTURE: Culture: <10000 — AB

## 2018-05-17 ENCOUNTER — Other Ambulatory Visit: Payer: Self-pay | Admitting: Orthopedic Surgery

## 2018-05-17 DIAGNOSIS — S39012A Strain of muscle, fascia and tendon of lower back, initial encounter: Secondary | ICD-10-CM

## 2018-05-31 ENCOUNTER — Ambulatory Visit
Admission: RE | Admit: 2018-05-31 | Discharge: 2018-05-31 | Disposition: A | Discharge status: Home / Self Care | Payer: Medicaid Other | Source: Ambulatory Visit | Attending: Orthopedic Surgery | Admitting: Orthopedic Surgery

## 2018-05-31 ENCOUNTER — Other Ambulatory Visit: Payer: Medicaid Other

## 2018-05-31 ENCOUNTER — Ambulatory Visit
Admission: RE | Admit: 2018-05-31 | Discharge: 2018-05-31 | Disposition: A | Discharge status: Home / Self Care | Payer: Worker's Compensation | Source: Ambulatory Visit | Attending: Orthopedic Surgery | Admitting: Orthopedic Surgery

## 2018-05-31 DIAGNOSIS — S39012A Strain of muscle, fascia and tendon of lower back, initial encounter: Secondary | ICD-10-CM

## 2018-05-31 MED ORDER — IOPAMIDOL (ISOVUE-M 200) INJECTION 41%
15.0000 mL | Freq: Once | INTRAMUSCULAR | Status: AC
  Administered 2018-05-31: 15 mL via INTRATHECAL

## 2018-05-31 MED ORDER — DIAZEPAM 5 MG PO TABS
10.0000 mg | ORAL_TABLET | Freq: Once | ORAL | Status: AC
  Administered 2018-05-31: 10 mg via ORAL

## 2018-05-31 NOTE — Discharge Instructions (Signed)

## 2018-06-18 ENCOUNTER — Encounter (HOSPITAL_COMMUNITY): Payer: Self-pay | Admitting: *Deleted

## 2018-06-18 ENCOUNTER — Other Ambulatory Visit: Payer: Self-pay

## 2018-06-18 DIAGNOSIS — Z79899 Other long term (current) drug therapy: Secondary | ICD-10-CM | POA: Diagnosis not present

## 2018-06-18 DIAGNOSIS — F17210 Nicotine dependence, cigarettes, uncomplicated: Secondary | ICD-10-CM | POA: Diagnosis not present

## 2018-06-18 DIAGNOSIS — R51 Headache: Secondary | ICD-10-CM | POA: Diagnosis present

## 2018-06-18 DIAGNOSIS — G971 Other reaction to spinal and lumbar puncture: Secondary | ICD-10-CM | POA: Diagnosis not present

## 2018-06-18 NOTE — ED Triage Notes (Signed)
Pt states that he was having problems with his back, went to lay on the floor and started having a headache with nausea, had myelogram performed two weeks ago for lower back pain,

## 2018-06-19 ENCOUNTER — Emergency Department (HOSPITAL_COMMUNITY)
Admission: EM | Admit: 2018-06-19 | Discharge: 2018-06-19 | Disposition: A | Discharge status: Home / Self Care | Payer: PRIVATE HEALTH INSURANCE | Attending: Emergency Medicine | Admitting: Emergency Medicine

## 2018-06-19 ENCOUNTER — Emergency Department (HOSPITAL_COMMUNITY): Payer: PRIVATE HEALTH INSURANCE

## 2018-06-19 DIAGNOSIS — G971 Other reaction to spinal and lumbar puncture: Secondary | ICD-10-CM

## 2018-06-19 MED ORDER — SODIUM CHLORIDE 0.9 % IV BOLUS
1000.0000 mL | Freq: Once | INTRAVENOUS | Status: AC
  Administered 2018-06-19: 1000 mL via INTRAVENOUS

## 2018-06-19 MED ORDER — DIPHENHYDRAMINE HCL 50 MG/ML IJ SOLN
25.0000 mg | Freq: Once | INTRAMUSCULAR | Status: AC
  Administered 2018-06-19: 25 mg via INTRAVENOUS
  Filled 2018-06-19: qty 1

## 2018-06-19 MED ORDER — VALPROATE SODIUM 500 MG/5ML IV SOLN
1000.0000 mg | Freq: Once | INTRAVENOUS | Status: AC
  Administered 2018-06-19: 1000 mg via INTRAVENOUS
  Filled 2018-06-19: qty 10

## 2018-06-19 MED ORDER — DEXAMETHASONE SODIUM PHOSPHATE 10 MG/ML IJ SOLN
10.0000 mg | Freq: Once | INTRAMUSCULAR | Status: AC
  Administered 2018-06-19: 10 mg via INTRAVENOUS
  Filled 2018-06-19: qty 1

## 2018-06-19 MED ORDER — METOCLOPRAMIDE HCL 5 MG/ML IJ SOLN
10.0000 mg | Freq: Once | INTRAMUSCULAR | Status: AC
  Administered 2018-06-19: 10 mg via INTRAVENOUS
  Filled 2018-06-19: qty 2

## 2018-06-19 MED ORDER — VALPROATE SODIUM 500 MG/5ML IV SOLN
INTRAVENOUS | Status: AC
  Filled 2018-06-19: qty 10

## 2018-06-19 MED ORDER — MAGNESIUM SULFATE 2 GM/50ML IV SOLN
2.0000 g | Freq: Once | INTRAVENOUS | Status: AC
  Administered 2018-06-19: 2 g via INTRAVENOUS
  Filled 2018-06-19: qty 50

## 2018-06-19 NOTE — ED Provider Notes (Signed)
[facility] EMERGENCY DEPARTMENT Provider Note   CSN: [id number] Arrival date & time: 06/18/18  2301  Time seen 12:30 AM   History   Chief Complaint Chief Complaint  Patient presents with  . Headache    HPI Kurt Gillespie is a 32 y.o. male.  HPI patient has been having some low back pain that is being evaluated by Dr. Darrelyn Hillock.  He had a myelogram and CT lumbar spine with contrast done on October 23 and reports he had a headache the day after that.  He states he stayed at home and rested an extra day and the headache went away.  He states about 7 PM tonight his back was bothering him so he laid down on the floor of his bedroom and states he had sudden onset of headache with pain on the top of his head that is more intense than the sides.  He denies any neck pain.  He describes the pain as throbbing.  He has photophobia and phono sensitivity.  He has nausea without vomiting.  He denies numbness or tingling although he stated when the pain first started he had numbness in his right foot for about a minute that resolved when he stood up.  He has had numbness in that foot before related to his back problems.  He states he does not have a history of headaches.  He has no family history of migraines.  PCP Patient, No Pcp Per   Past Medical History:  Diagnosis Date  . Bipolar 1 disorder (HCC)   . Bronchitis    as a child  . GERD (gastroesophageal reflux disease)   . Nephrolithiasis     There are no active problems to display for this patient.   Past Surgical History:  Procedure Laterality Date  . MULTIPLE EXTRACTIONS WITH ALVEOLOPLASTY N/A 09/17/2013   Procedure: MULTIPLE EXTRACION WITH ALVEOLOPLASTY;  Surgeon: Georgia Lopes, DDS;  Location: [facility] OR;  Service: Oral Surgery;  Laterality: N/A;  . WISDOM TOOTH EXTRACTION  1999   age 23 years        Home Medications    relafen 750 mg  Prior to Admission medications   Medication Sig Start Date End Date Taking? Authorizing Provider   nabumetone (RELAFEN) 750 MG tablet Take 750 mg by mouth 2 (two) times daily.   Yes [provider]  acetaminophen (TYLENOL) 500 MG tablet Take 1,000 mg by mouth every 8 (eight) hours as needed. pain    [provider]  ibuprofen (ADVIL,MOTRIN) 800 MG tablet Take 1 tablet (800 mg total) by mouth 3 (three) times daily. 04/11/18   Elson Areas, PA-C    Family History No family history on file.  Social History Social History   Tobacco Use  . Smoking status: Current Every Day Smoker    Packs/day: 0.50    Years: 10.00    Pack years: 5.00    Types: Cigarettes  . Smokeless tobacco: Never Used  Substance Use Topics  . Alcohol use: No    Comment: occasional  . 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   Review of Systems Review of Systems  All other systems reviewed and are negative.    Physical Exam Updated Vital Signs BP 136/72   Pulse 84   Temp 97.9 F (36.6 C) (Oral)   Resp 16   Ht 5\' 11"  (1.803 m)   Wt 113.4 kg  SpO2 97%   BMI 34.87 kg/m   Physical Exam  Constitutional: He is oriented to person, place, and time. He appears well-developed and well-nourished.  Non-toxic appearance. He does not appear ill. He appears distressed.  Appears uncomfortable  HENT:  Head: Normocephalic and atraumatic.  Right Ear: External ear normal.  Left Ear: External ear normal.  Nose: Nose normal. No mucosal edema or rhinorrhea.  Mouth/Throat: Oropharynx is clear and moist and mucous membranes are normal. No dental abscesses or uvula swelling.  Eyes: Pupils are equal, round, and reactive to light. Conjunctivae and EOM are normal.  Neck: Normal range of motion and full passive range of motion without pain. Neck supple.  Cardiovascular: Normal rate, regular rhythm and normal heart sounds. Exam reveals no gallop and no friction rub.  No murmur heard. Pulmonary/Chest: Effort normal and breath  sounds normal. No respiratory distress. He has no wheezes. He has no rhonchi. He has no rales. He exhibits no tenderness and no crepitus.  Abdominal: Soft. Normal appearance and bowel sounds are normal. He exhibits no distension. There is no tenderness. There is no rebound and no guarding.  Musculoskeletal: Normal range of motion. He exhibits no edema or tenderness.  Moves all extremities well.   Neurological: He is alert and oriented to person, place, and time. He has normal strength. No cranial nerve deficit.  Grips are equal, he has no focal weakness.  Skin: Skin is warm, dry and intact. No rash noted. No erythema. No pallor.  Psychiatric: He has a normal mood and affect. His speech is normal and behavior is normal. His mood appears not anxious.  Nursing note and vitals reviewed.    ED Treatments / Results  Labs (all labs ordered are listed, but only abnormal results are displayed) Labs Reviewed - No data to display  EKG None  Radiology Ct Head Wo Contrast  Result Date: 06/19/2018 CLINICAL DATA:  Acute onset of headache and nausea. EXAM: CT HEAD WITHOUT CONTRAST TECHNIQUE: Contiguous axial images were obtained from the base of the skull through the vertex without intravenous contrast. COMPARISON:  None. FINDINGS: Brain: No evidence of acute infarction, hemorrhage, hydrocephalus, extra-axial collection or mass lesion/mass effect. The posterior fossa, including the cerebellum, brainstem and fourth ventricle, is within normal limits. The third and lateral ventricles, and basal ganglia are unremarkable in appearance. The cerebral hemispheres are symmetric in appearance, with normal gray-white differentiation. No mass effect or midline shift is seen. Vascular: No hyperdense vessel or unexpected calcification. Skull: There is no evidence of fracture; visualized osseous structures are unremarkable in appearance. Sinuses/Orbits: The orbits are within normal limits. Small mucus retention cysts or  polyps are noted at the left maxillary sinus. The remaining paranasal sinuses and mastoid air cells are well-aerated. Other: No significant soft tissue abnormalities are seen. IMPRESSION: 1. No acute intracranial pathology seen on CT. 2. Small mucus retention cysts or polyps at the left maxillary sinus. Electronically Signed   By: Roanna Raider M.D.   On: 06/19/2018 01:49    Procedures Procedures (including critical care time)  Medications Ordered in ED Medications  sodium chloride 0.9 % bolus 1,000 mL (0 mLs Intravenous Stopped 06/19/18 0218)  metoCLOPramide (REGLAN) injection 10 mg (10 mg Intravenous Given 06/19/18 0056)  diphenhydrAMINE (BENADRYL) injection 25 mg (25 mg Intravenous Given 06/19/18 0056)  dexamethasone (DECADRON) injection 10 mg (10 mg Intravenous Given 06/19/18 0218)  magnesium sulfate IVPB 2 g 50 mL (0 g Intravenous Stopped 06/19/18 0332)  sodium chloride 0.9 % bolus 1,000  mL (0 mLs Intravenous Stopped 06/19/18 0610)  valproate (DEPACON) 1,000 mg in dextrose 5 % 50 mL IVPB ( Intravenous Stopped 06/19/18 0602)     Initial Impression / Assessment and Plan / ED Course  I have reviewed the triage vital signs and the nursing notes.  Pertinent labs & imaging results that were available during my care of the patient were reviewed by me and considered in my medical decision making (see chart for details).    Patient is describing a typical migraine headache although it did happen acutely.  He was given migraine cocktail and CT scan was done to look for acute subarachnoid hemorrhage or other acute intracranial abnormality.  Recheck at 2:05 AM patient states his headache is better but still present.  We discussed his CT results.  I added magnesium and Decadron to his medication since his scan does not show evidence of a bleed.  Recheck at 3:40 AM patient states when he lays still he only has pain in a well localized area in his left temple but when he sits up he gets pain in  the top of his head.  We discussed that he might need to follow-up with the IR department to get a blood patch for possible spinal fluid leak from his myelogram.  It seems a little bit removed from the time of his procedure but the way he describes the pain as being very positional makes it suggestive.  He was given more fluids which can help with the spinal fluid leak and he was given Depacon.  6:10 AM I saw patient walking from the bathroom to his room.  When I go when he states he is feeling much better.  He states he had a lot of urinary output.  He states his headache is much improved and at no longer chills and when he sits up.  He feels ready to be discharged.  He verifies he had his myelogram done at [facility].  I gave him the number to call them this morning and let them know what was going on to see if they feel like he needs a blood patch.  We also discussed that he needs to drink plenty of fluids because that will make the headache better.  Final Clinical Impressions(s) / ED Diagnoses   Final diagnoses:  Post lumbar puncture headache    ED Discharge Orders    None      Plan discharge  Devoria Albe, MD, Concha Pyo, MD 06/19/18 [id number]

## 2018-06-19 NOTE — ED Notes (Signed)
Pt ambulatory to waiting room. Pt verbalized understanding of discharge instructions.   

## 2018-06-19 NOTE — Discharge Instructions (Addendum)
Please continue drinking a lot of fluids. You can now take acetaminophen 1000 mg every 6 hrs for pain. Call [facility] to talk to the radiologist to see if you need a blood patch.
